# Patient Record
Sex: Male | Born: 1995 | Race: White | Hispanic: No | Marital: Married | Smoking: Former smoker
Health system: Southern US, Community
[De-identification: ages and names within clinical notes are randomized; demographics above are authoritative.]

## PROBLEM LIST (undated history)

## (undated) DIAGNOSIS — L409 Psoriasis, unspecified: Secondary | ICD-10-CM

---
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
## 2016-11-30 ENCOUNTER — Encounter (HOSPITAL_COMMUNITY): Payer: Self-pay | Admitting: *Deleted

## 2016-11-30 ENCOUNTER — Emergency Department (HOSPITAL_COMMUNITY)
Admission: EM | Admit: 2016-11-30 | Discharge: 2016-11-30 | Disposition: A | Discharge status: Home / Self Care | Payer: 59 | Attending: Emergency Medicine | Admitting: Emergency Medicine

## 2016-11-30 DIAGNOSIS — Z87891 Personal history of nicotine dependence: Secondary | ICD-10-CM | POA: Insufficient documentation

## 2016-11-30 DIAGNOSIS — Y9389 Activity, other specified: Secondary | ICD-10-CM | POA: Diagnosis not present

## 2016-11-30 DIAGNOSIS — Y999 Unspecified external cause status: Secondary | ICD-10-CM | POA: Insufficient documentation

## 2016-11-30 DIAGNOSIS — Z79899 Other long term (current) drug therapy: Secondary | ICD-10-CM | POA: Diagnosis not present

## 2016-11-30 DIAGNOSIS — Y92410 Unspecified street and highway as the place of occurrence of the external cause: Secondary | ICD-10-CM | POA: Diagnosis not present

## 2016-11-30 DIAGNOSIS — M542 Cervicalgia: Secondary | ICD-10-CM | POA: Insufficient documentation

## 2016-11-30 MED ORDER — CYCLOBENZAPRINE HCL 10 MG PO TABS: 10.0000 mg | ORAL_TABLET | Freq: Two times a day (BID) | ORAL | 0 refills | Status: DC | PRN

## 2016-11-30 MED ORDER — NAPROXEN 375 MG PO TABS: 375.0000 mg | ORAL_TABLET | Freq: Two times a day (BID) | ORAL | 0 refills | Status: DC
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
## 2016-11-30 NOTE — Discharge Instructions (Signed)
Wear the collar as needed for comfort
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
## 2016-11-30 NOTE — ED Notes (Signed)
Pt is complaining of right sided neck pain that is a 6/10. No headache. Is able to move head in all directions, just painful

## 2017-10-03 ENCOUNTER — Emergency Department (HOSPITAL_COMMUNITY)
Admission: EM | Admit: 2017-10-03 | Discharge: 2017-10-03 | Disposition: A | Discharge status: Home / Self Care | Payer: 59 | Attending: Emergency Medicine | Admitting: Emergency Medicine

## 2017-10-03 ENCOUNTER — Other Ambulatory Visit: Payer: Self-pay

## 2017-10-03 ENCOUNTER — Encounter (HOSPITAL_COMMUNITY): Payer: Self-pay | Admitting: Emergency Medicine

## 2017-10-03 ENCOUNTER — Emergency Department (HOSPITAL_COMMUNITY): Payer: 59

## 2017-10-03 DIAGNOSIS — N50811 Right testicular pain: Secondary | ICD-10-CM | POA: Diagnosis present

## 2017-10-03 DIAGNOSIS — F17220 Nicotine dependence, chewing tobacco, uncomplicated: Secondary | ICD-10-CM | POA: Insufficient documentation

## 2017-10-03 DIAGNOSIS — R102 Pelvic and perineal pain: Secondary | ICD-10-CM

## 2017-10-03 LAB — URINALYSIS, ROUTINE W REFLEX MICROSCOPIC
BILIRUBIN URINE: NEGATIVE
Glucose, UA: NEGATIVE mg/dL
HGB URINE DIPSTICK: NEGATIVE
Ketones, ur: NEGATIVE mg/dL
Leukocytes, UA: NEGATIVE
NITRITE: NEGATIVE
PROTEIN: NEGATIVE mg/dL
Specific Gravity, Urine: 1.003 — ABNORMAL LOW (ref 1.005–1.030)
pH: 7 (ref 5.0–8.0)

## 2017-10-03 MED ORDER — IBUPROFEN 600 MG PO TABS: 600.0000 mg | ORAL_TABLET | Freq: Four times a day (QID) | ORAL | 0 refills | Status: AC

## 2017-10-03 MED ORDER — HYDROCODONE-ACETAMINOPHEN 5-325 MG PO TABS
2.0000 | ORAL_TABLET | Freq: Once | ORAL | Status: AC
  Administered 2017-10-03: 2 via ORAL
  Filled 2017-10-03: qty 2

## 2017-10-03 MED ORDER — ONDANSETRON HCL 4 MG PO TABS
4.0000 mg | ORAL_TABLET | Freq: Once | ORAL | Status: AC
  Administered 2017-10-03: 4 mg via ORAL
  Filled 2017-10-03: qty 1

## 2017-10-03 MED ORDER — METHOCARBAMOL 500 MG PO TABS: 500.0000 mg | ORAL_TABLET | Freq: Three times a day (TID) | ORAL | 0 refills | Status: DC

## 2017-10-03 NOTE — Discharge Instructions (Signed)
Your blood pressure is slightly elevated at 166/94, otherwise your vital signs within normal limits.  Your urine test is negative for kidney stone, or kidney infection, or other acute problems.  Your ultrasound is negative for testicular torsion, scrotal mass, or acute abnormality within the scrotal sac area.  I suspect that you have muscle strain in the perineal area with referred pain to the testicle area.  [facility] see [facility] for additional evaluation if this is not improving.  Please use 600 mg of ibuprofen, and 1000 mg of Tylenol with breakfast, lunch, dinner, and at bedtime.  Please use Robaxin for spasm type pain in the perineal area.  If this is not improving, please see the urology specialist.

## 2017-10-03 NOTE — ED Provider Notes (Signed)
[facility] EMERGENCY DEPARTMENT Provider Note   CSN: [id number] Arrival date & time: 10/03/17  1730     History   Chief Complaint Chief Complaint  Patient presents with  . Testicle Pain    HPI Dillon Mcdonald is a 22 y.o. male.  Patient is a 22 year old male who presents to the emergency department with a complaint of testicular pain.  Patient states that the pain seems to come and go.  It is been going on for the past 3 days.  The patient denies any recent injury or trauma to the area.  He denies any pain of the penis.  He has not had any drainage from the penis.  No discharge or blood from the penis.  No recent operations or procedures involving the testicle area.  The history is provided by the patient.  Testicle Pain  Pertinent negatives include no chest pain, no abdominal pain and no shortness of breath.    History reviewed. No pertinent past medical history.  There are no active problems to display for this patient.   History reviewed. No pertinent surgical history.      Home Medications    Prior to Admission medications   Medication Sig Start Date End Date Taking? Authorizing Provider  cetirizine (ZYRTEC) 10 MG tablet Take 1 tablet (10 mg total) by mouth daily. 05/04/12   Laurell Josephs, MD  cyclobenzaprine (FLEXERIL) 10 MG tablet Take 1 tablet (10 mg total) by mouth 2 (two) times daily as needed for muscle spasms. 11/30/16   Felicie Morn, NP  naproxen (NAPROSYN) 375 MG tablet Take 1 tablet (375 mg total) by mouth 2 (two) times daily. 11/30/16   Felicie Morn, NP    Family History No family history on file.  Social History Social History   Tobacco Use  . Smoking status: Former Games developer  . Smokeless tobacco: Current User  Substance Use Topics  . Alcohol use: Yes    Alcohol/week: 6.0 standard drinks    Types: 6 Cans of beer per week    Comment: every other day   . Drug use: Not Currently     Allergies   Patient has no known allergies.   Review of  Systems Review of Systems  Constitutional: Negative for activity change.       All ROS Neg except as noted in HPI  HENT: Negative for nosebleeds.   Eyes: Negative for photophobia and discharge.  Respiratory: Negative for cough, shortness of breath and wheezing.   Cardiovascular: Negative for chest pain and palpitations.  Gastrointestinal: Negative for abdominal pain and blood in stool.  Genitourinary: Positive for testicular pain. Negative for dysuria, frequency and hematuria.  Musculoskeletal: Negative for arthralgias, back pain and neck pain.  Skin: Negative.   Neurological: Negative for dizziness, seizures and speech difficulty.  Psychiatric/Behavioral: Negative for confusion and hallucinations.     Physical Exam Updated Vital Signs BP (!) 166/94 (BP Location: Right Arm)   Pulse 80   Temp 98.2 F (36.8 C) (Oral)   Resp 12   Ht 5\' 9"  (1.753 m)   Wt 77.1 kg   SpO2 98%   BMI 25.10 kg/m   Physical Exam  Constitutional: He is oriented to person, place, and time. He appears well-developed and well-nourished.  Non-toxic appearance.  HENT:  Head: Normocephalic.  Right Ear: Tympanic membrane and external ear normal.  Left Ear: Tympanic membrane and external ear normal.  Eyes: Pupils are equal, round, and reactive to light. EOM and lids are normal.  Neck: Normal range of motion. Neck supple. Carotid bruit is not present.  Cardiovascular: Normal rate, regular rhythm, normal heart sounds, intact distal pulses and normal pulses.  Pulmonary/Chest: Breath sounds normal. No respiratory distress.  Abdominal: Soft. Bowel sounds are normal. There is no tenderness. There is no guarding.  Genitourinary:  Genitourinary Comments: Patient significant other is in the room during the examination.  No abnormality noted of the penis.  The patient is circumcised.  Both testicles are descended in the scrotal sac.  There is no mass appreciated. There is some tenderness of the right testicle.  No  tenderness of the left.  No unusual swelling about the testicle.  There is also tenderness in the perineal area.  There is no drainage or discharge or broken skin in this area.  Musculoskeletal: Normal range of motion.  Lymphadenopathy:       Head (right side): No submandibular adenopathy present.       Head (left side): No submandibular adenopathy present.    He has no cervical adenopathy.  Neurological: He is alert and oriented to person, place, and time. He has normal strength. No cranial nerve deficit or sensory deficit.  Skin: Skin is warm and dry.  Psychiatric: He has a normal mood and affect. His speech is normal.  Nursing note and vitals reviewed.    ED Treatments / Results  Labs (all labs ordered are listed, but only abnormal results are displayed) Labs Reviewed  URINALYSIS, ROUTINE W REFLEX MICROSCOPIC - Abnormal; Notable for the following components:      Result Value   Color, Urine COLORLESS (*)    Specific Gravity, Urine 1.003 (*)    All other components within normal limits    EKG None  Radiology [place] Scrotum W/doppler  Result Date: 10/03/2017 CLINICAL DATA:  22 year old male with 3 days of right testicular pain. EXAM: SCROTAL ULTRASOUND DOPPLER ULTRASOUND OF THE TESTICLES TECHNIQUE: Complete ultrasound examination of the testicles, epididymis, and other scrotal structures was performed. Color and spectral Doppler ultrasound were also utilized to evaluate blood flow to the testicles. COMPARISON:  None. FINDINGS: Right testicle Measurements: 4.3 x 2.2 x 2.9 centimeters. No mass or microlithiasis visualized. Left testicle Measurements: 4.4 x 1.8 x 2.7 centimeters. No mass or microlithiasis visualized. Right epididymis:  Normal in size and appearance. Left epididymis:  Normal in size and appearance. Hydrocele:  None visualized. Varicocele:  None visualized. Pulsed Doppler interrogation of both testes demonstrates normal low resistance arterial and venous waveforms bilaterally.  IMPRESSION: Negative for testicular torsion. Normal scrotal ultrasound with Doppler. Electronically Signed   By: Odessa FlemingH  Hall M.D.   On: 10/03/2017 19:45    Procedures Procedures (including critical care time)  Medications Ordered in ED Medications  HYDROcodone-acetaminophen (NORCO/VICODIN) 5-325 MG per tablet 2 tablet (2 tablets Oral Given 10/03/17 1836)  ondansetron (ZOFRAN) tablet 4 mg (4 mg Oral Given 10/03/17 1835)     Initial Impression / Assessment and Plan / ED Course  I have reviewed the triage vital signs and the nursing notes.  Pertinent labs & imaging results that were available during my care of the patient were reviewed by me and considered in my medical decision making (see chart for details).     You  Final Clinical Impressions(s) / ED Diagnoses MDM  Blood pressure slightly elevated at 141/97, otherwise vital signs within normal limits.  Pulse oximetry is 96% on room air.  Within normal limits by my interpretation.  The patient has some right testicular tenderness, as well as  some peritoneal tenderness.  Will obtain an ultrasound to rule out torsion, mass, or other related issues.   Urine analysis is negative for any acute changes or problems.  Ultrasound is completely normal.  I suspect that the patient has muscle strain in the perineal area with referred testicular pain.  I have asked the patient to use ibuprofen and Tylenol with breakfast, lunch, dinner, and at bedtime.  I have also asked the patient to see the physicians at [facility] urology for additional evaluation if not improving.  Patient is in agreement with this plan.   Final diagnoses:  Pain in right testicle    ED Discharge Orders         Ordered    ibuprofen (ADVIL,MOTRIN) 600 MG tablet  4 times daily     10/03/17 2028    methocarbamol (ROBAXIN) 500 MG tablet  3 times daily     10/03/17 2028           Ivery Quale, PA-C 10/03/17 2040    Raeford Razor, MD 10/04/17 1450

## 2017-10-03 NOTE — ED Triage Notes (Signed)
Testicular pain on and off x 3 days.  Denies any urinary s/s

## 2018-03-11 ENCOUNTER — Encounter (HOSPITAL_COMMUNITY): Payer: Self-pay | Admitting: Emergency Medicine

## 2018-03-11 ENCOUNTER — Emergency Department (HOSPITAL_COMMUNITY)
Admission: EM | Admit: 2018-03-11 | Discharge: 2018-03-11 | Disposition: A | Discharge status: Home / Self Care | Payer: 59 | Attending: Emergency Medicine | Admitting: Emergency Medicine

## 2018-03-11 ENCOUNTER — Other Ambulatory Visit: Payer: Self-pay

## 2018-03-11 ENCOUNTER — Emergency Department (HOSPITAL_COMMUNITY): Payer: 59

## 2018-03-11 DIAGNOSIS — S90871A Other superficial bite of right foot, initial encounter: Secondary | ICD-10-CM | POA: Insufficient documentation

## 2018-03-11 DIAGNOSIS — Y998 Other external cause status: Secondary | ICD-10-CM | POA: Insufficient documentation

## 2018-03-11 DIAGNOSIS — W540XXA Bitten by dog, initial encounter: Secondary | ICD-10-CM | POA: Insufficient documentation

## 2018-03-11 DIAGNOSIS — Z87891 Personal history of nicotine dependence: Secondary | ICD-10-CM | POA: Diagnosis not present

## 2018-03-11 DIAGNOSIS — Y929 Unspecified place or not applicable: Secondary | ICD-10-CM | POA: Insufficient documentation

## 2018-03-11 DIAGNOSIS — Z79899 Other long term (current) drug therapy: Secondary | ICD-10-CM | POA: Diagnosis not present

## 2018-03-11 DIAGNOSIS — Y9389 Activity, other specified: Secondary | ICD-10-CM | POA: Diagnosis not present

## 2018-03-11 MED ORDER — AMOXICILLIN-POT CLAVULANATE 875-125 MG PO TABS
1.0000 | ORAL_TABLET | Freq: Once | ORAL | Status: AC
  Administered 2018-03-11: 1 via ORAL
  Filled 2018-03-11: qty 1

## 2018-03-11 MED ORDER — IBUPROFEN 800 MG PO TABS
800.0000 mg | ORAL_TABLET | Freq: Once | ORAL | Status: AC
  Administered 2018-03-11: 800 mg via ORAL
  Filled 2018-03-11: qty 1

## 2018-03-11 MED ORDER — AMOXICILLIN-POT CLAVULANATE 875-125 MG PO TABS: 1.0000 | ORAL_TABLET | Freq: Two times a day (BID) | ORAL | 0 refills | Status: DC

## 2018-03-11 MED ORDER — NAPROXEN 500 MG PO TABS: 500.0000 mg | ORAL_TABLET | Freq: Two times a day (BID) | ORAL | 0 refills | Status: AC

## 2018-03-11 MED ORDER — HYDROCODONE-ACETAMINOPHEN 5-325 MG PO TABS: 1.0000 | ORAL_TABLET | Freq: Four times a day (QID) | ORAL | 0 refills | Status: DC | PRN

## 2018-03-11 NOTE — ED Triage Notes (Signed)
Pt was accidentally bitten by his dog last to RT foot. Pt has bruising, edema, and redness. Pt states area is warm to touch. Denies fever and discharge at this time. Reports that it is extremely painful to walk. Dog is UTD on all vaccinations per family.

## 2018-03-11 NOTE — Discharge Instructions (Signed)
Your x-ray shows no signs of retained foreign body, the redness of the skin suggest an early infection, take Augmentin twice daily for 7 days, Naprosyn 500 mg twice a day as needed, try to stay off the foot keep it elevated and keep ice on it to prevent increased swelling however if you should develop increasing pain, swelling, fever you should return to the emergency department immediately.  Otherwise see your doctor in 48 hours for recheck.  If you do not have a doctor see the list below.  [facility] Primary Care Doctor List    Kari Baars MD. Specialty: Pulmonary Disease Contact information: [address]  [address]  [place] [address]  [phone]   Syliva Overman, MD. Specialty: [facility] Medicine Contact information: [address]  [place] [address]  [phone]   Lilyan Punt, MD. Specialty: [facility] Medicine Contact information: [address]  [place] [address]  [phone]   Avon Gully, MD Specialty: Internal Medicine Contact information: [address] [place] [address]  [phone]   Catalina Pizza, MD. Specialty: Internal Medicine Contact information: [address]  [place] [address]  [phone]    [facility] (Dr. Selena Batten) Specialty: Family Medicine Contact information: [address]  [place] [address]  [phone]   John Giovanni, MD. Specialty: [facility] Medicine Contact information: [address]  [address]  [place] [address]  [phone]   Carylon Perches, MD. Specialty: Internal Medicine Contact information: [address]  [address]  [place] [address]  [phone]    [facility]  [address] [place], [place] [address] [phone]  Services [facility] offers a variety of basic health services.  Services include but are not limited to: Blood pressure checks  Heart rate checks  Blood  sugar checks  Urine analysis  Rapid strep tests  Pregnancy tests.  Health education and referrals  People needing more complex services will be directed to a physician online. Using these virtual visits, doctors can evaluate and prescribe medicine and treatments. There will be no medication on-site, though [facility] will help patients fill their prescriptions at little to no cost.   For More information please go to: [email or web address]

## 2018-03-11 NOTE — ED Provider Notes (Signed)
[facility] EMERGENCY DEPARTMENT Provider Note   CSN: [id number] Arrival date & time: 03/11/18  1016     History   Chief Complaint Chief Complaint  Patient presents with  . Animal Bite    HPI Dillon Mcdonald is a 23 y.o. male.  HPI  23 year old male, no significant past medical history who presents after suffering an injury to his right foot which occurred last night when he stuck his foot between 2 of his dogs who were wrestling.  1 of them bit him on the foot causing a puncture wound to both the plantar aspect of the foot as well as the dorsum of the foot.  Since that time he has had pain redness and mild swelling in the puncture areas, the pain does not radiate up his leg, he does not have any fevers or chills, he is up-to-date on tetanus and his dogs were up-to-date on all of their vaccinations as well.  He states that the dogs were acting in their normal behavior and he was just trying to separate them with his foot while they were wrestling.  Pain is persistent, it was treated with hydrogen peroxide prior to arrival  History reviewed. No pertinent past medical history.  There are no active problems to display for this patient.   History reviewed. No pertinent surgical history.      Home Medications    Prior to Admission medications   Medication Sig Start Date End Date Taking? Authorizing Provider  cetirizine (ZYRTEC) 10 MG tablet Take 1 tablet (10 mg total) by mouth daily. 05/04/12   Laurell JosephsKhalifa, Dalia A, MD  cyclobenzaprine (FLEXERIL) 10 MG tablet Take 1 tablet (10 mg total) by mouth 2 (two) times daily as needed for muscle spasms. 11/30/16   Felicie MornSmith, David, NP  ibuprofen (ADVIL,MOTRIN) 600 MG tablet Take 1 tablet (600 mg total) by mouth 4 (four) times daily. 10/03/17   Ivery QualeBryant, Hobson, PA-C  methocarbamol (ROBAXIN) 500 MG tablet Take 1 tablet (500 mg total) by mouth 3 (three) times daily. 10/03/17   Ivery QualeBryant, Hobson, PA-C  naproxen (NAPROSYN) 375 MG tablet Take 1 tablet (375 mg total)  by mouth 2 (two) times daily. 11/30/16   Felicie MornSmith, David, NP    Family History No family history on file.  Social History Social History   Tobacco Use  . Smoking status: Former Games developermoker  . Smokeless tobacco: Current User  Substance Use Topics  . Alcohol use: Yes    Alcohol/week: 6.0 standard drinks    Types: 6 Cans of beer per week    Comment: every other day   . Drug use: Not Currently     Allergies   Patient has no known allergies.   Review of Systems Review of Systems  Constitutional: Negative for fever.  Musculoskeletal: Positive for myalgias.  Skin: Positive for wound.     Physical Exam Updated Vital Signs BP 112/86 (BP Location: Right Arm)   Pulse 84   Temp 98.2 F (36.8 C) (Oral)   Resp 16   Ht 1.727 m (5\' 8" )   Wt 79.4 kg   SpO2 99%   BMI 26.61 kg/m   Physical Exam Vitals signs and nursing note reviewed.  Constitutional:      Appearance: He is well-developed. He is not diaphoretic.  HENT:     Head: Normocephalic and atraumatic.  Eyes:     General:        Right eye: No discharge.        Left eye: No discharge.  Conjunctiva/sclera: Conjunctivae normal.  Pulmonary:     Effort: Pulmonary effort is normal. No respiratory distress.  Musculoskeletal:     Comments: Tenderness over the foot over the puncture wounds, see below, no other joint swelling or tenderness or lymphangitic spread of redness.  Skin:    General: Skin is warm and dry.     Findings: Rash present. No erythema.     Comments: There is a puncture wound to the plantar aspect of the right foot with some surrounding redness, minimal swelling, no drainage, no visible foreign body, slight puncture wound to the dorsum of the foot as well.  No swelling of the ankle  Neurological:     Mental Status: He is alert.     Coordination: Coordination normal.      ED Treatments / Results  Labs (all labs ordered are listed, but only abnormal results are displayed) Labs Reviewed - No data to  display  EKG None  Radiology No results found.  Procedures Procedures (including critical care time)  Medications Ordered in ED Medications  ibuprofen (ADVIL,MOTRIN) tablet 800 mg (has no administration in time range)  amoxicillin-clavulanate (AUGMENTIN) 875-125 MG per tablet 1 tablet (has no administration in time range)     Initial Impression / Assessment and Plan / ED Course  I have reviewed the triage vital signs and the nursing notes.  Pertinent labs & imaging results that were available during my care of the patient were reviewed by me and considered in my medical decision making (see chart for details).     Well-appearing, rule out foreign body, evaluate with x-ray, tetanus is up-to-date, pain control antibiotics.      The patient has no signs of foreign body on x-ray, he is stable appearing, I have encouraged him to stay off his foot keep it elevated, pain medicines and antibiotics have been given.  [facility], he has no history of opiate filling.  Final Clinical Impressions(s) / ED Diagnoses   Final diagnoses:  Dog bite, initial encounter    ED Discharge Orders         Ordered    amoxicillin-clavulanate (AUGMENTIN) 875-125 MG tablet  Every 12 hours     03/11/18 1302    naproxen (NAPROSYN) 500 MG tablet  2 times daily with meals     03/11/18 1302    HYDROcodone-acetaminophen (NORCO/VICODIN) 5-325 MG tablet  Every 6 hours PRN     03/11/18 1302           Eber HongMiller, Odies Desa, MD 03/11/18 1304

## 2019-01-18 ENCOUNTER — Other Ambulatory Visit: Payer: Self-pay

## 2019-01-18 DIAGNOSIS — Z20822 Contact with and (suspected) exposure to covid-19: Secondary | ICD-10-CM

## 2019-01-19 LAB — NOVEL CORONAVIRUS, NAA: SARS-CoV-2, NAA: NOT DETECTED

## 2019-02-19 ENCOUNTER — Ambulatory Visit: Payer: BC Managed Care – PPO | Attending: Internal Medicine

## 2019-02-19 ENCOUNTER — Other Ambulatory Visit: Payer: Self-pay

## 2019-02-19 DIAGNOSIS — Z20822 Contact with and (suspected) exposure to covid-19: Secondary | ICD-10-CM | POA: Insufficient documentation

## 2019-02-20 LAB — NOVEL CORONAVIRUS, NAA: SARS-CoV-2, NAA: NOT DETECTED

## 2019-02-23 ENCOUNTER — Other Ambulatory Visit: Payer: Self-pay

## 2019-05-17 ENCOUNTER — Telehealth: Payer: Self-pay

## 2019-05-17 NOTE — Telephone Encounter (Signed)
Fax received from [facility] stating that they are no longer the contracted provider for the patient's medication Otezla.  They did forward the referral to the correct Pharmacy [facility].  See below.

## 2019-05-23 ENCOUNTER — Telehealth: Payer: Self-pay

## 2019-05-23 DIAGNOSIS — L400 Psoriasis vulgaris: Secondary | ICD-10-CM

## 2019-05-23 MED ORDER — OTEZLA 30 MG PO TABS: 30.0000 mg | ORAL_TABLET | Freq: Two times a day (BID) | ORAL | 0 refills | Status: DC

## 2019-05-23 NOTE — Telephone Encounter (Signed)
Phone call from patient stating that he needs a refill on his [place] sent to [facility].  I informed patient that he needs an appointment and that we can give him one refill until his appointment.  Patient okay with that, appointment made with Mackey Birchwood on 06/19/2019 @ 8:15am.

## 2019-06-19 ENCOUNTER — Ambulatory Visit: Payer: Self-pay | Admitting: Physician Assistant

## 2019-10-23 ENCOUNTER — Other Ambulatory Visit: Payer: Self-pay | Admitting: Physician Assistant

## 2019-10-23 DIAGNOSIS — L400 Psoriasis vulgaris: Secondary | ICD-10-CM

## 2019-10-23 NOTE — Telephone Encounter (Signed)
Wants refill of Otezla. Mail order pharmacy where he had gotten it is out of business + he was switched to another place and was told they needed to get info from [place] that they never got. Wants to know if he can have refill without ov. KRS patient. Chart [phone]

## 2019-10-23 NOTE — Telephone Encounter (Signed)
We can refill otezla as long as patient has been seen within the year.  Tried calling patient to see what pharmacy to send prescription of otezla too. Unable to leave voicemail; voicemail full

## 2019-10-24 MED ORDER — OTEZLA 30 MG PO TABS: 30.0000 mg | ORAL_TABLET | Freq: Two times a day (BID) | ORAL | 2 refills | Status: DC

## 2019-10-24 NOTE — Telephone Encounter (Signed)
Patient called back and prescription sent to [facility] in .

## 2019-10-24 NOTE — Telephone Encounter (Signed)
Patient returned call and asked that refill be sent to [facility] on [address] in [place]

## 2019-10-25 ENCOUNTER — Other Ambulatory Visit: Payer: Self-pay | Admitting: Physician Assistant

## 2019-10-25 DIAGNOSIS — L400 Psoriasis vulgaris: Secondary | ICD-10-CM

## 2019-10-25 MED ORDER — OTEZLA 30 MG PO TABS: 30.0000 mg | ORAL_TABLET | Freq: Two times a day (BID) | ORAL | 2 refills | Status: DC

## 2019-10-25 MED ORDER — OTEZLA 10 & 20 & 30 MG PO TBPK: ORAL_TABLET | ORAL | 0 refills | Status: DC

## 2019-10-25 NOTE — Telephone Encounter (Signed)
Spoke with patient and got updated insurance; also informed patient that [facility] doesn't typically rill biologics. Sent prescription to [facility].  Patient states he hasn't taken for 3 months so per [facility] [place] patient needs starter dose too.

## 2019-10-25 NOTE — Telephone Encounter (Signed)
Rx refill was received by the pharmacy but his insurance changed and it needs a PA from [facility] per patient. He brought his new insurance card to the office and it was updated in Epic.

## 2019-11-03 ENCOUNTER — Encounter (HOSPITAL_COMMUNITY): Payer: Self-pay | Admitting: Emergency Medicine

## 2019-11-03 ENCOUNTER — Emergency Department (HOSPITAL_COMMUNITY): Payer: BC Managed Care – PPO

## 2019-11-03 ENCOUNTER — Emergency Department (HOSPITAL_COMMUNITY)
Admission: EM | Admit: 2019-11-03 | Discharge: 2019-11-03 | Disposition: A | Discharge status: Home / Self Care | Payer: BC Managed Care – PPO | Attending: Emergency Medicine | Admitting: Emergency Medicine

## 2019-11-03 ENCOUNTER — Other Ambulatory Visit: Payer: Self-pay

## 2019-11-03 DIAGNOSIS — R072 Precordial pain: Secondary | ICD-10-CM | POA: Diagnosis not present

## 2019-11-03 DIAGNOSIS — F419 Anxiety disorder, unspecified: Secondary | ICD-10-CM | POA: Diagnosis not present

## 2019-11-03 DIAGNOSIS — Z87891 Personal history of nicotine dependence: Secondary | ICD-10-CM | POA: Diagnosis not present

## 2019-11-03 HISTORY — DX: Psoriasis, unspecified: L40.9

## 2019-11-03 MED ORDER — IBUPROFEN 800 MG PO TABS
800.0000 mg | ORAL_TABLET | Freq: Once | ORAL | Status: AC
Start: 2019-11-03 — End: 2019-11-03
  Administered 2019-11-03: 800 mg via ORAL
  Filled 2019-11-03: qty 1

## 2019-11-03 NOTE — ED Triage Notes (Signed)
Pt states he felt like his heart racing last night finally went to sleep. Started again this morning and checked bp and  It was 157/94 then later 176/103. States chest feels tight. Denies light headedness, dizziness, change in vision. Pt states BP was high when he was a teenager. States he has some anxiety but never been dx or treated. Denies alcohol consumption and illicit drug use. States he had one cup of coffee yesterday morning.

## 2019-11-03 NOTE — ED Notes (Signed)
Reports felt that his heart was racing last night and had difficulty sleeping   Felt the same upon awakening   Hx HTN   Reports anxiety but sees no provider for same

## 2019-11-03 NOTE — Discharge Instructions (Addendum)
Your EKG and chest x-ray today are reassuring.  The recheck of your blood pressure was also improved.  You may want to have your blood pressure rechecked by your primary care provider next week.  Return to the ER if you develop any worsening symptoms.

## 2019-11-06 ENCOUNTER — Emergency Department (HOSPITAL_COMMUNITY): Payer: BC Managed Care – PPO

## 2019-11-06 ENCOUNTER — Other Ambulatory Visit: Payer: Self-pay

## 2019-11-06 ENCOUNTER — Encounter (HOSPITAL_COMMUNITY): Payer: Self-pay | Admitting: Emergency Medicine

## 2019-11-06 ENCOUNTER — Other Ambulatory Visit: Payer: BC Managed Care – PPO

## 2019-11-06 ENCOUNTER — Ambulatory Visit: Payer: Self-pay

## 2019-11-06 ENCOUNTER — Emergency Department (HOSPITAL_COMMUNITY)
Admission: EM | Admit: 2019-11-06 | Discharge: 2019-11-06 | Disposition: A | Discharge status: Home / Self Care | Payer: BC Managed Care – PPO | Attending: Emergency Medicine | Admitting: Emergency Medicine

## 2019-11-06 DIAGNOSIS — Z20822 Contact with and (suspected) exposure to covid-19: Secondary | ICD-10-CM | POA: Insufficient documentation

## 2019-11-06 DIAGNOSIS — R079 Chest pain, unspecified: Secondary | ICD-10-CM | POA: Diagnosis not present

## 2019-11-06 DIAGNOSIS — R0602 Shortness of breath: Secondary | ICD-10-CM | POA: Diagnosis not present

## 2019-11-06 DIAGNOSIS — Z5321 Procedure and treatment not carried out due to patient leaving prior to being seen by health care provider: Secondary | ICD-10-CM | POA: Insufficient documentation

## 2019-11-06 LAB — BASIC METABOLIC PANEL
Anion gap: 11 (ref 5–15)
BUN: 10 mg/dL (ref 6–20)
CO2: 26 mmol/L (ref 22–32)
Calcium: 9.8 mg/dL (ref 8.9–10.3)
Chloride: 103 mmol/L (ref 98–111)
Creatinine, Ser: 0.77 mg/dL (ref 0.61–1.24)
GFR calc Af Amer: >60 mL/min (ref >60)
GFR calc non Af Amer: >60 mL/min (ref >60)
Glucose, Bld: 85 mg/dL (ref 70–99)
Potassium: 3.7 mmol/L (ref 3.5–5.1)
Sodium: 140 mmol/L (ref 135–145)

## 2019-11-06 LAB — RESP PANEL BY RT PCR (RSV, FLU A&B, COVID)
Influenza A by PCR: NEGATIVE
Influenza B by PCR: NEGATIVE
Respiratory Syncytial Virus by PCR: NEGATIVE
SARS Coronavirus 2 by RT PCR: NEGATIVE

## 2019-11-06 LAB — TROPONIN I (HIGH SENSITIVITY): Troponin I (High Sensitivity): 2 ng/L (ref <18)

## 2019-11-06 LAB — CBC
HCT: 44.7 % (ref 39.0–52.0)
Hemoglobin: 15.4 g/dL (ref 13.0–17.0)
MCH: 31.2 pg (ref 26.0–34.0)
MCHC: 34.5 g/dL (ref 30.0–36.0)
MCV: 90.7 fL (ref 80.0–100.0)
Platelets: 190 10*3/uL (ref 150–400)
RBC: 4.93 MIL/uL (ref 4.22–5.81)
RDW: 11.8 % (ref 11.5–15.5)
WBC: 6.5 10*3/uL (ref 4.0–10.5)
nRBC: 0 % (ref 0.0–0.2)

## 2019-11-06 NOTE — ED Triage Notes (Signed)
Pt c/o chest pain and sob since Sat. Pt was seen for the same here. Pt wants covid test.

## 2019-11-06 NOTE — ED Provider Notes (Signed)
[facility] EMERGENCY DEPARTMENT Provider Note   CSN: [id number] Arrival date & time: 11/03/19  1152     History Chief Complaint  Patient presents with  . Hypertension    Dillon Mcdonald is a 24 y.o. male.  HPI      Dillon Mcdonald is a 24 y.o. male who presents to the Emergency Department complaining of heart racing and feeling anxious.  Symptoms began on the evening prior to arrival.  He states that he woke up this morning and felt the symptoms returned again.  He checked his blood pressure, it was 157/94 10 to 15 minutes later he checked it again and it was 176/103 he states he began feeling tightness in his chest and a family member suggested that he come to the emergency department for further evaluation.  He does not describe having chest pain.  He does admit to occasional episodes of feeling anxious and admits to increased stressors at work recently.  He denies any history of heart disease or significant family history of early onset heart disease.  He denies alcohol or drug use, shortness of breath, headache, dizziness, visual changes, and syncope.     Past Medical History:  Diagnosis Date  . Psoriasis     There are no problems to display for this patient.   History reviewed. No pertinent surgical history.     No family history on file.  Social History   Tobacco Use  . Smoking status: Former Games developer  . Smokeless tobacco: Current User  Vaping Use  . Vaping Use: Every day  Substance Use Topics  . Alcohol use: Yes    Alcohol/week: 6.0 standard drinks    Types: 6 Cans of beer per week    Comment: every other day   . Drug use: Not Currently    Home Medications Prior to Admission medications   Medication Sig Start Date End Date Taking? Authorizing Provider  alclomethasone (ACLOVATE) 0.05 % cream Apply 1 application topically 2 (two) times daily.  02/21/18   [provider]  amoxicillin-clavulanate (AUGMENTIN) 875-125 MG tablet Take 1 tablet by mouth every 12  (twelve) hours. 03/11/18   Eber Hong, MD  Apremilast (OTEZLA) 10 & 20 & 30 MG TBPK Take as directed per package instructions. 10/25/19   Sheffield, Judye Bos, PA-C  Apremilast (OTEZLA) 30 MG TABS Take 1 tablet (30 mg total) by mouth 2 (two) times daily. 10/25/19   Sheffield, Harvin Hazel R, PA-C  clobetasol (TEMOVATE) 0.05 % external solution APPLY TO THE AFFECTED AREA EVERY DAY TO TWICE DAILY 10/23/19   Janalyn Harder, MD  fluocinonide cream (LIDEX) 0.05 % Apply 1 application topically 2 (two) times daily.  02/21/18   [provider]  HYDROcodone-acetaminophen (NORCO/VICODIN) 5-325 MG tablet Take 1 tablet by mouth every 6 (six) hours as needed. 03/11/18   Eber Hong, MD  ibuprofen (ADVIL,MOTRIN) 600 MG tablet Take 1 tablet (600 mg total) by mouth 4 (four) times daily. Patient taking differently: Take 400 mg by mouth every 6 (six) hours as needed for headache or mild pain.  10/03/17   Ivery Quale, PA-C  ketoconazole (NIZORAL) 2 % shampoo Apply 1 application topically 2 (two) times a week.  02/21/18   [provider]  naproxen (NAPROSYN) 500 MG tablet Take 1 tablet (500 mg total) by mouth 2 (two) times daily with a meal. 03/11/18   Eber Hong, MD    Allergies    Patient has no known allergies.  Review of Systems   Review of Systems  Constitutional: Negative for chills, fatigue and fever.  HENT: Negative for sore throat and trouble swallowing.   Eyes: Negative for visual disturbance.  Respiratory: Positive for chest tightness. Negative for cough, shortness of breath and wheezing.   Cardiovascular: Positive for palpitations. Negative for chest pain.  Gastrointestinal: Negative for abdominal pain, blood in stool, nausea and vomiting.  Genitourinary: Negative for dysuria, flank pain and hematuria.  Musculoskeletal: Negative for arthralgias, back pain, myalgias, neck pain and neck stiffness.  Skin: Negative for rash.  Neurological: Negative for dizziness, weakness, numbness and  headaches.  Hematological: Does not bruise/bleed easily.  Psychiatric/Behavioral: The patient is nervous/anxious.     Physical Exam Updated Vital Signs BP 132/89 (BP Location: Right Arm)   Pulse (!) 54   Temp 98.8 F (37.1 C) (Oral)   Resp 16   Ht 5\' 9"  (1.753 m)   Wt 78 kg   SpO2 98%   BMI 25.40 kg/m   Physical Exam Vitals and nursing note reviewed.  Constitutional:      General: He is not in acute distress.    Appearance: Normal appearance. He is not ill-appearing.     Comments: Patient appears slightly anxious  HENT:     Head: Normocephalic.     Mouth/Throat:     Mouth: Mucous membranes are moist.  Eyes:     Extraocular Movements: Extraocular movements intact.     Conjunctiva/sclera: Conjunctivae normal.     Pupils: Pupils are equal, round, and reactive to light.  Neck:     Thyroid: No thyromegaly.     Meningeal: Kernig's sign absent.  Cardiovascular:     Rate and Rhythm: Normal rate and regular rhythm.     Pulses: Normal pulses.  Pulmonary:     Effort: Pulmonary effort is normal. No respiratory distress.     Breath sounds: Normal breath sounds. No wheezing.  Chest:     Chest wall: No tenderness.  Abdominal:     Palpations: Abdomen is soft.     Tenderness: There is no abdominal tenderness. There is no guarding or rebound.  Musculoskeletal:        General: Normal range of motion.     Cervical back: Normal range of motion and neck supple.  Skin:    General: Skin is warm.     Capillary Refill: Capillary refill takes less than 2 seconds.     Findings: No rash.  Neurological:     General: No focal deficit present.     Mental Status: He is alert and oriented to person, place, and time.     Sensory: Sensation is intact. No sensory deficit.     Motor: Motor function is intact. No weakness.     Coordination: Coordination is intact.     Comments: CN II through XII intact.  Speech clear.  No motor weakness.  Psychiatric:        Mood and Affect: Mood is anxious.         Speech: Speech normal.        Behavior: Behavior normal.        Thought Content: Thought content normal.     ED Results / Procedures / Treatments   Labs (all labs ordered are listed, but only abnormal results are displayed) Labs Reviewed - No data to display  EKG EKG Interpretation  Date/Time:  Saturday November 03 2019 12:15:25 EDT Ventricular Rate:  63 PR Interval:  168 QRS Duration: 102 QT Interval:  360 QTC Calculation: 368 R Axis:   91  Text Interpretation: Normal sinus rhythm Rightward axis Early repolarization Borderline ECG ECG OTHERWISE WITHIN NORMAL LIMITS Confirmed by Eber Hong ([id number]) on 11/03/2019 12:37:55 PM   Radiology DG Chest 2 View  Result Date: 11/03/2019 CLINICAL DATA:  Tachycardia EXAM: CHEST - 2 VIEW COMPARISON:  11/10/2007 FINDINGS: The cardiac silhouette, mediastinal and hilar contours are normal. The lungs are clear. No pleural effusion. No pulmonary lesions. The bony thorax is intact. IMPRESSION: Normal chest x-ray. Electronically Signed   By: Rudie Meyer M.D.   On: 11/03/2019 13:21     Procedures Procedures (including critical care time)  Medications Ordered in ED Medications  ibuprofen (ADVIL) tablet 800 mg (800 mg Oral Given 11/03/19 1303)    ED Course  I have reviewed the triage vital signs and the nursing notes.  Pertinent labs & imaging results that were available during my care of the patient were reviewed by me and considered in my medical decision making (see chart for details).    MDM Rules/Calculators/A&P                          Patient here with intermittent chest tightness and feelings of tachycardia.  No prior heart disease or significant family history of early onset heart disease.  Denies alcohol or illicit drug use.  He does admit to recent increased stressors.  No SI or HI.  Chest x-ray is reassuring.  EKG also reassuring with no evidence of acute ischemic changes.  Patient has been reassured.  On recheck, blood  pressure improved.  He is not tachycardic.  Appears more calm.  I feel his symptoms are likely related to anxiety.  He does have a PCP and agrees to arrange close follow-up.  I feel that he is appropriate for discharge home, return precautions were discussed.  Final Clinical Impression(s) / ED Diagnoses Final diagnoses:  Precordial chest pain  Anxiety    Rx / DC Orders ED Discharge Orders    None       Rosey Bath 11/06/19 0934    Eber Hong, MD 11/10/19 [phone]

## 2019-11-06 NOTE — Telephone Encounter (Signed)
Pt. Called to report intermittent chest pain and Shortness of breath since late Friday into Saturday.  Went to ER on Sat.  Was advised symptoms could be related to anxiety.  At present c/o a burning pain around heart left side of chest.  Stated the pain comes and goes but has been present during the 20 min. Triage call.  Stated he has had chills and intermittent nausea.  Reported only dry heaves.  Denied radiation of chest pain.  Has not been tested recently for COVID.  Denied any known exposure to COVID.  Also c/o body aches, fatigue and sore throat. Stated he might feel slightly better since Sat., but voiced concern of the intermittent chest pain.  Denied cough.  Advised to go to [facility] or ER for re-evaluation. Pt. Verb. Understanding.  Stated he will go to the ER now.  Reason for Disposition . Chest pain or pressure  Answer Assessment - Initial Assessment Questions 1. COVID-19 DIAGNOSIS: "Who made your Coronavirus (COVID-19) diagnosis?" "Was it confirmed by a positive lab test?" If not diagnosed by a HCP, ask "Are there lots of cases (community spread) where you live?" (See public health department website, if unsure)     Has not been diagnosed; went to ER on Sat. For chest discomfort 2. COVID-19 EXPOSURE: "Was there any known exposure to COVID before the symptoms began?" CDC Definition of close contact: within 6 feet (2 meters) for a total of 15 minutes or more over a 24-hour period.      No known exposure  3. ONSET: "When did the COVID-19 symptoms start?"      Friday late night into Saturday, 9/25 4. WORST SYMPTOM: "What is your worst symptom?" (e.g., cough, fever, shortness of breath, muscle aches)     c/o chest tightness/ pressure left chest  That comes and goes 5. COUGH: "Do you have a cough?" If Yes, ask: "How bad is the cough?"       Denied cough 6. FEVER: "Do you have a fever?" If Yes, ask: "What is your temperature, how was it measured, and when did it start?"    C/o chills ; no fever  7.  RESPIRATORY STATUS: "Describe your breathing?" (e.g., shortness of breath, wheezing, unable to speak)      Shortness of breath that comes and goes; feels tired when trying to talk  8. BETTER-SAME-WORSE: "Are you getting better, staying the same or getting worse compared to yesterday?"  If getting worse, ask, "In what way?"     Feels a little better compared to when in ER on Sat.  9. HIGH RISK DISEASE: "Do you have any chronic medical problems?" (e.g., asthma, heart or lung disease, weak immune system, obesity, etc.)     Hx of high blood pressure. Denied lung or heart problems 10. PREGNANCY: "Is there any chance you are pregnant?" "When was your last menstrual period?"       N/a  11. OTHER SYMPTOMS: "Do you have any other symptoms?"  (e.g., chills, fatigue, headache, loss of smell or taste, muscle pain, sore throat; new loss of smell or taste especially support the diagnosis of COVID-19)      Fatigue, nausea, body aches, sore throat  Protocols used: CORONAVIRUS (COVID-19) DIAGNOSED OR SUSPECTED-A-AH

## 2020-02-05 ENCOUNTER — Telehealth: Payer: Self-pay | Admitting: *Deleted

## 2020-02-05 NOTE — Telephone Encounter (Signed)
Phone call to patient to let him know that we need to make him an office visit its been over a year to follow up on his otezla. I also faxed over his prior authorization form to [facility] at [phone].

## 2020-02-12 ENCOUNTER — Telehealth: Payer: Self-pay

## 2020-02-12 NOTE — Telephone Encounter (Signed)
Fax received from [facility] stating patient's Dillon Mcdonald is approved form 02/05/2020-02/03/2021.

## 2020-02-13 ENCOUNTER — Telehealth: Payer: Self-pay | Admitting: Dermatology

## 2020-02-15 NOTE — Telephone Encounter (Signed)
error 

## 2020-03-19 ENCOUNTER — Ambulatory Visit: Payer: BC Managed Care – PPO | Admitting: Physician Assistant

## 2020-03-19 ENCOUNTER — Encounter: Payer: Self-pay | Admitting: Physician Assistant

## 2020-03-19 ENCOUNTER — Other Ambulatory Visit: Payer: Self-pay

## 2020-03-19 DIAGNOSIS — Z79899 Other long term (current) drug therapy: Secondary | ICD-10-CM | POA: Diagnosis not present

## 2020-03-19 DIAGNOSIS — L400 Psoriasis vulgaris: Secondary | ICD-10-CM | POA: Diagnosis not present

## 2020-03-19 MED ORDER — ALCLOMETASONE DIPROPIONATE 0.05 % EX CREA: 1.0000 "application " | TOPICAL_CREAM | Freq: Two times a day (BID) | CUTANEOUS | 2 refills | Status: DC

## 2020-03-19 MED ORDER — CLOBETASOL PROPIONATE 0.05 % EX SOLN: CUTANEOUS | 2 refills | Status: DC

## 2020-03-19 MED ORDER — KETOCONAZOLE 2 % EX SHAM: 1.0000 "application " | MEDICATED_SHAMPOO | CUTANEOUS | 2 refills | Status: DC

## 2020-03-19 MED ORDER — FLUOCINONIDE EMULSIFIED BASE 0.05 % EX CREA: 1.0000 "application " | TOPICAL_CREAM | Freq: Two times a day (BID) | CUTANEOUS | 2 refills | Status: AC

## 2020-03-21 LAB — HEPATITIS B CORE ANTIBODY, TOTAL: Hep B Core Total Ab: NONREACTIVE

## 2020-03-21 LAB — COMPREHENSIVE METABOLIC PANEL
AG Ratio: 1.7 (calc) (ref 1.0–2.5)
ALT: 56 U/L — ABNORMAL HIGH (ref 9–46)
AST: 31 U/L (ref 10–40)
Albumin: 4.3 g/dL (ref 3.6–5.1)
Alkaline phosphatase (APISO): 54 U/L (ref 36–130)
BUN: 22 mg/dL (ref 7–25)
CO2: 27 mmol/L (ref 20–32)
Calcium: 9.7 mg/dL (ref 8.6–10.3)
Chloride: 103 mmol/L (ref 98–110)
Creat: 0.98 mg/dL (ref 0.60–1.35)
Globulin: 2.5 g/dL (calc) (ref 1.9–3.7)
Glucose, Bld: 75 mg/dL (ref 65–139)
Potassium: 3.9 mmol/L (ref 3.5–5.3)
Sodium: 140 mmol/L (ref 135–146)
Total Bilirubin: 0.7 mg/dL (ref 0.2–1.2)
Total Protein: 6.8 g/dL (ref 6.1–8.1)

## 2020-03-21 LAB — CBC WITH DIFFERENTIAL/PLATELET
Absolute Monocytes: 481 cells/uL (ref 200–950)
Basophils Absolute: 29 cells/uL (ref 0–200)
Basophils Relative: 0.5 %
Eosinophils Absolute: 99 cells/uL (ref 15–500)
Eosinophils Relative: 1.7 %
HCT: 42.7 % (ref 38.5–50.0)
Hemoglobin: 14.8 g/dL (ref 13.2–17.1)
Lymphs Abs: 2024 cells/uL (ref 850–3900)
MCH: 30.3 pg (ref 27.0–33.0)
MCHC: 34.7 g/dL (ref 32.0–36.0)
MCV: 87.5 fL (ref 80.0–100.0)
MPV: 11.5 fL (ref 7.5–12.5)
Monocytes Relative: 8.3 %
Neutro Abs: 3167 cells/uL (ref 1500–7800)
Neutrophils Relative %: 54.6 %
Platelets: 185 10*3/uL (ref 140–400)
RBC: 4.88 10*6/uL (ref 4.20–5.80)
RDW: 13.1 % (ref 11.0–15.0)
Total Lymphocyte: 34.9 %
WBC: 5.8 10*3/uL (ref 3.8–10.8)

## 2020-03-21 LAB — QUANTIFERON-TB GOLD PLUS
Mitogen-NIL: 8.87 IU/mL
NIL: 0.04 IU/mL
QuantiFERON-TB Gold Plus: NEGATIVE
TB1-NIL: 0 IU/mL
TB2-NIL: 0.01 IU/mL

## 2020-03-21 LAB — HEPATITIS C ANTIBODY
Hepatitis C Ab: NONREACTIVE
SIGNAL TO CUT-OFF: 0.01 (ref <1.00)

## 2020-03-21 LAB — ANA: Anti Nuclear Antibody (ANA): NEGATIVE

## 2020-03-21 LAB — HEPATITIS B SURFACE ANTIGEN: Hepatitis B Surface Ag: NONREACTIVE

## 2020-03-21 LAB — HEPATITIS B SURFACE ANTIBODY, QUANTITATIVE: Hep B S AB Quant (Post): <5 m[IU]/mL — ABNORMAL LOW (ref >=10)

## 2020-03-26 ENCOUNTER — Telehealth: Payer: Self-pay | Admitting: Physician Assistant

## 2020-03-26 NOTE — Telephone Encounter (Signed)
Said he was supposed to get a call about the shots (doesn't know name of med) he's going to get for psoriasis, but hasn't heard anything. Wants to know what's going on.

## 2020-03-26 NOTE — Telephone Encounter (Signed)
Patient aware we have his labs and we can move forward on the injection tremfya .

## 2020-03-27 NOTE — Telephone Encounter (Signed)
Faxed all info to [facility] [phone] And tremfya [facility]) [phone]

## 2020-04-09 ENCOUNTER — Encounter: Payer: Self-pay | Admitting: Physician Assistant

## 2020-04-09 NOTE — Progress Notes (Addendum)
Follow-Up Visit   Subjective  Dillon Mcdonald is a 25 y.o. male who presents for the following: Procedure (FLARE ALL OVER ).   The following portions of the chart were reviewed this encounter and updated as appropriate:  Tobacco  Allergies  Meds  Problems  Med Hx  Surg Hx  Fam Hx      Objective  Well appearing patient in no apparent distress; mood and affect are within normal limits.  A full examination was performed including scalp, head, eyes, ears, nose, lips, neck, chest, axillae, abdomen, back, buttocks, bilateral upper extremities, bilateral lower extremities, hands, feet, fingers, toes, fingernails, and toenails. All findings within normal limits unless otherwise noted below.  Objective  Head - Anterior (Face), Left Forearm - Anterior, Left Forearm - Posterior, Left Hand - Anterior, Left Upper Arm - Anterior, Left Upper Arm - Posterior, Right Forearm - Anterior, Right Forearm - Posterior, Right Hand - Anterior, Right Upper Arm - Anterior, Right Upper Arm - Posterior, Scalp: Well-marginated erythematous papules/plaques with silvery scale.  Significant flare with high level of severity on the central face and scalp.No swollen joints or tenosynovitis.  Assessment & Plan  Psoriasis vulgaris (12) Head - Anterior (Face); Left Hand - Anterior; Right Hand - Anterior; Left Upper Arm - Anterior; Right Upper Arm - Anterior; Left Upper Arm - Posterior; Right Upper Arm - Posterior; Left Forearm - Anterior; Right Forearm - Anterior; Left Forearm - Posterior; Right Forearm - Posterior; Scalp  Start Tremfya paperwork due to increased severity of his psoriasis.  Comprehensive metabolic panel - Head - Anterior (Face), Left Forearm - Anterior, Left Forearm - Posterior, Left Hand - Anterior, Left Upper Arm - Anterior, Left Upper Arm - Posterior, Right Forearm - Anterior, Right Forearm - Posterior, Right Hand - Anterior, Right Upper Arm - Anterior, Right Upper Arm - Posterior, Scalp  CBC with  Differential/Platelet - Head - Anterior (Face), Left Forearm - Anterior, Left Forearm - Posterior, Left Hand - Anterior, Left Upper Arm - Anterior, Left Upper Arm - Posterior, Right Forearm - Anterior, Right Forearm - Posterior, Right Hand - Anterior, Right Upper Arm - Anterior, Right Upper Arm - Posterior, Scalp  Hepatitis B surface antibody,quantitative - Head - Anterior (Face), Left Forearm - Anterior, Left Forearm - Posterior, Left Hand - Anterior, Left Upper Arm - Anterior, Left Upper Arm - Posterior, Right Forearm - Anterior, Right Forearm - Posterior, Right Hand - Anterior, Right Upper Arm - Anterior, Right Upper Arm - Posterior, Scalp  Hepatitis B surface antigen - Head - Anterior (Face), Left Forearm - Anterior, Left Forearm - Posterior, Left Hand - Anterior, Left Upper Arm - Anterior, Left Upper Arm - Posterior, Right Forearm - Anterior, Right Forearm - Posterior, Right Hand - Anterior, Right Upper Arm - Anterior, Right Upper Arm - Posterior, Scalp  Hepatitis B core antibody, total - Head - Anterior (Face), Left Forearm - Anterior, Left Forearm - Posterior, Left Hand - Anterior, Left Upper Arm - Anterior, Left Upper Arm - Posterior, Right Forearm - Anterior, Right Forearm - Posterior, Right Hand - Anterior, Right Upper Arm - Anterior, Right Upper Arm - Posterior, Scalp  Hepatitis C antibody - Head - Anterior (Face), Left Forearm - Anterior, Left Forearm - Posterior, Left Hand - Anterior, Left Upper Arm - Anterior, Left Upper Arm - Posterior, Right Forearm - Anterior, Right Forearm - Posterior, Right Hand - Anterior, Right Upper Arm - Anterior, Right Upper Arm - Posterior, Scalp  QuantiFERON-TB Gold Plus - Head - Anterior (  Face), Left Forearm - Anterior, Left Forearm - Posterior, Left Hand - Anterior, Left Upper Arm - Anterior, Left Upper Arm - Posterior, Right Forearm - Anterior, Right Forearm - Posterior, Right Hand - Anterior, Right Upper Arm - Anterior, Right Upper Arm - Posterior,  Scalp  ANA - Head - Anterior (Face), Left Forearm - Anterior, Left Forearm - Posterior, Left Hand - Anterior, Left Upper Arm - Anterior, Left Upper Arm - Posterior, Right Forearm - Anterior, Right Forearm - Posterior, Right Hand - Anterior, Right Upper Arm - Anterior, Right Upper Arm - Posterior, Scalp  clobetasol (TEMOVATE) 0.05 % external solution - Head - Anterior (Face), Left Forearm - Anterior, Left Forearm - Posterior, Left Hand - Anterior, Left Upper Arm - Anterior, Left Upper Arm - Posterior, Right Forearm - Anterior, Right Forearm - Posterior, Right Hand - Anterior, Right Upper Arm - Anterior, Right Upper Arm - Posterior, Scalp  ketoconazole (NIZORAL) 2 % shampoo - Head - Anterior (Face), Left Forearm - Anterior, Left Forearm - Posterior, Left Hand - Anterior, Left Upper Arm - Anterior, Left Upper Arm - Posterior, Right Forearm - Anterior, Right Forearm - Posterior, Right Hand - Anterior, Right Upper Arm - Anterior, Right Upper Arm - Posterior, Scalp  fluocinonide-emollient (LIDEX-E) 0.05 % cream - Head - Anterior (Face), Left Forearm - Anterior, Left Forearm - Posterior, Left Hand - Anterior, Left Upper Arm - Anterior, Left Upper Arm - Posterior, Right Forearm - Anterior, Right Forearm - Posterior, Right Hand - Anterior, Right Upper Arm - Anterior, Right Upper Arm - Posterior, Scalp  alclomethasone (ACLOVATE) 0.05 % cream - Head - Anterior (Face), Left Forearm - Anterior, Left Forearm - Posterior, Left Hand - Anterior, Left Upper Arm - Anterior, Left Upper Arm - Posterior, Right Forearm - Anterior, Right Forearm - Posterior, Right Hand - Anterior, Right Upper Arm - Anterior, Right Upper Arm - Posterior, Scalp  Drug therapy  Other Related Procedures Comprehensive metabolic panel CBC with Differential/Platelet Hepatitis B surface antibody,quantitative Hepatitis B surface antigen Hepatitis B core antibody, total Hepatitis C antibody QuantiFERON-TB Gold Plus ANA   I, Koraima Albertsen,  PA-C, have reviewed all documentation's for this visit.  The documentation on 04/14/20 for the exam, diagnosis, procedures and orders are all accurate and complete.

## 2020-04-14 ENCOUNTER — Telehealth: Payer: Self-pay | Admitting: *Deleted

## 2020-04-14 NOTE — Addendum Note (Signed)
Addended by: Mackey Birchwood R on: 04/14/2020 11:48 AM   Modules accepted: Level of Service

## 2020-04-14 NOTE — Addendum Note (Signed)
Addended by: Mackey Birchwood R on: 04/14/2020 10:12 AM   Modules accepted: Level of Service

## 2020-04-14 NOTE — Telephone Encounter (Signed)
Prior authorization for Tremfya approved- 04/02/2020-04-01-2021.    PA # [id number]

## 2020-04-16 MED ORDER — TREMFYA 100 MG/ML ~~LOC~~ SOAJ: 100.0000 mg | SUBCUTANEOUS | 4 refills | Status: DC

## 2020-04-16 NOTE — Addendum Note (Signed)
Addended by: Johnna Acosta on: 04/16/2020 08:13 AM   Modules accepted: Orders

## 2020-04-16 NOTE — Telephone Encounter (Signed)
Per [facility] patient must fill with [facility]

## 2020-04-28 ENCOUNTER — Telehealth: Payer: Self-pay | Admitting: Physician Assistant

## 2020-04-28 NOTE — Telephone Encounter (Signed)
Called [facility] and a copay card was given for the patient to use with [facility]. ID [id number] BIN [id number] GROUP [id number] PLAN CODE PMD All was given to patient and was told to call and use copay card

## 2020-04-28 NOTE — Telephone Encounter (Signed)
Patient calling because Tremfya copay is over $2000 dollars. I informed patient that nurse will reach back out to him about financial assistance. Informed it could be 24-48 hours before he hears back from our office. Patient voiced understanding.

## 2020-09-28 IMAGING — DX DG CHEST 2V
2 series · 2 of 2 positions shown · non-contrast
Comparison: 11/03/2019

CLINICAL DATA: Shortness of breath

EXAM:
CHEST - 2 VIEW

[chest pa]
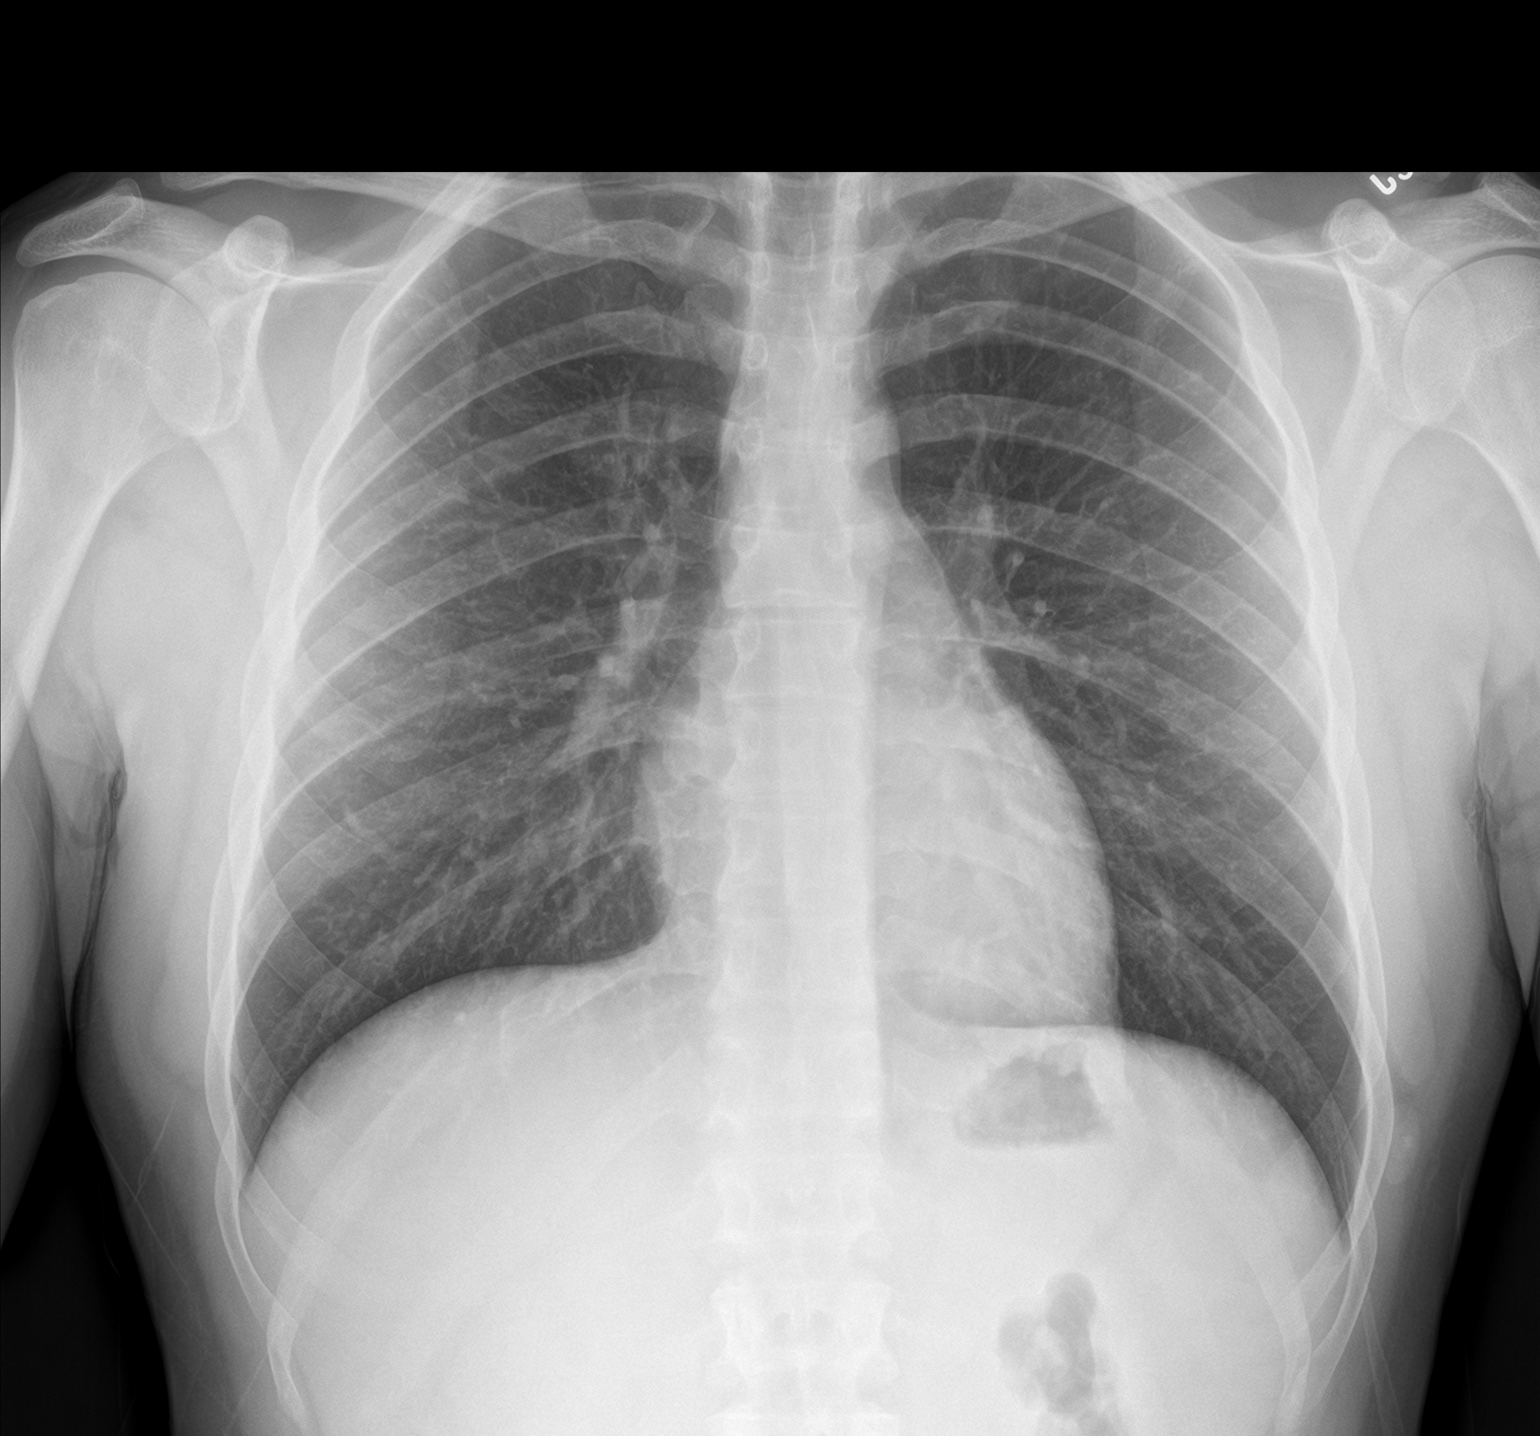

[chest lat]
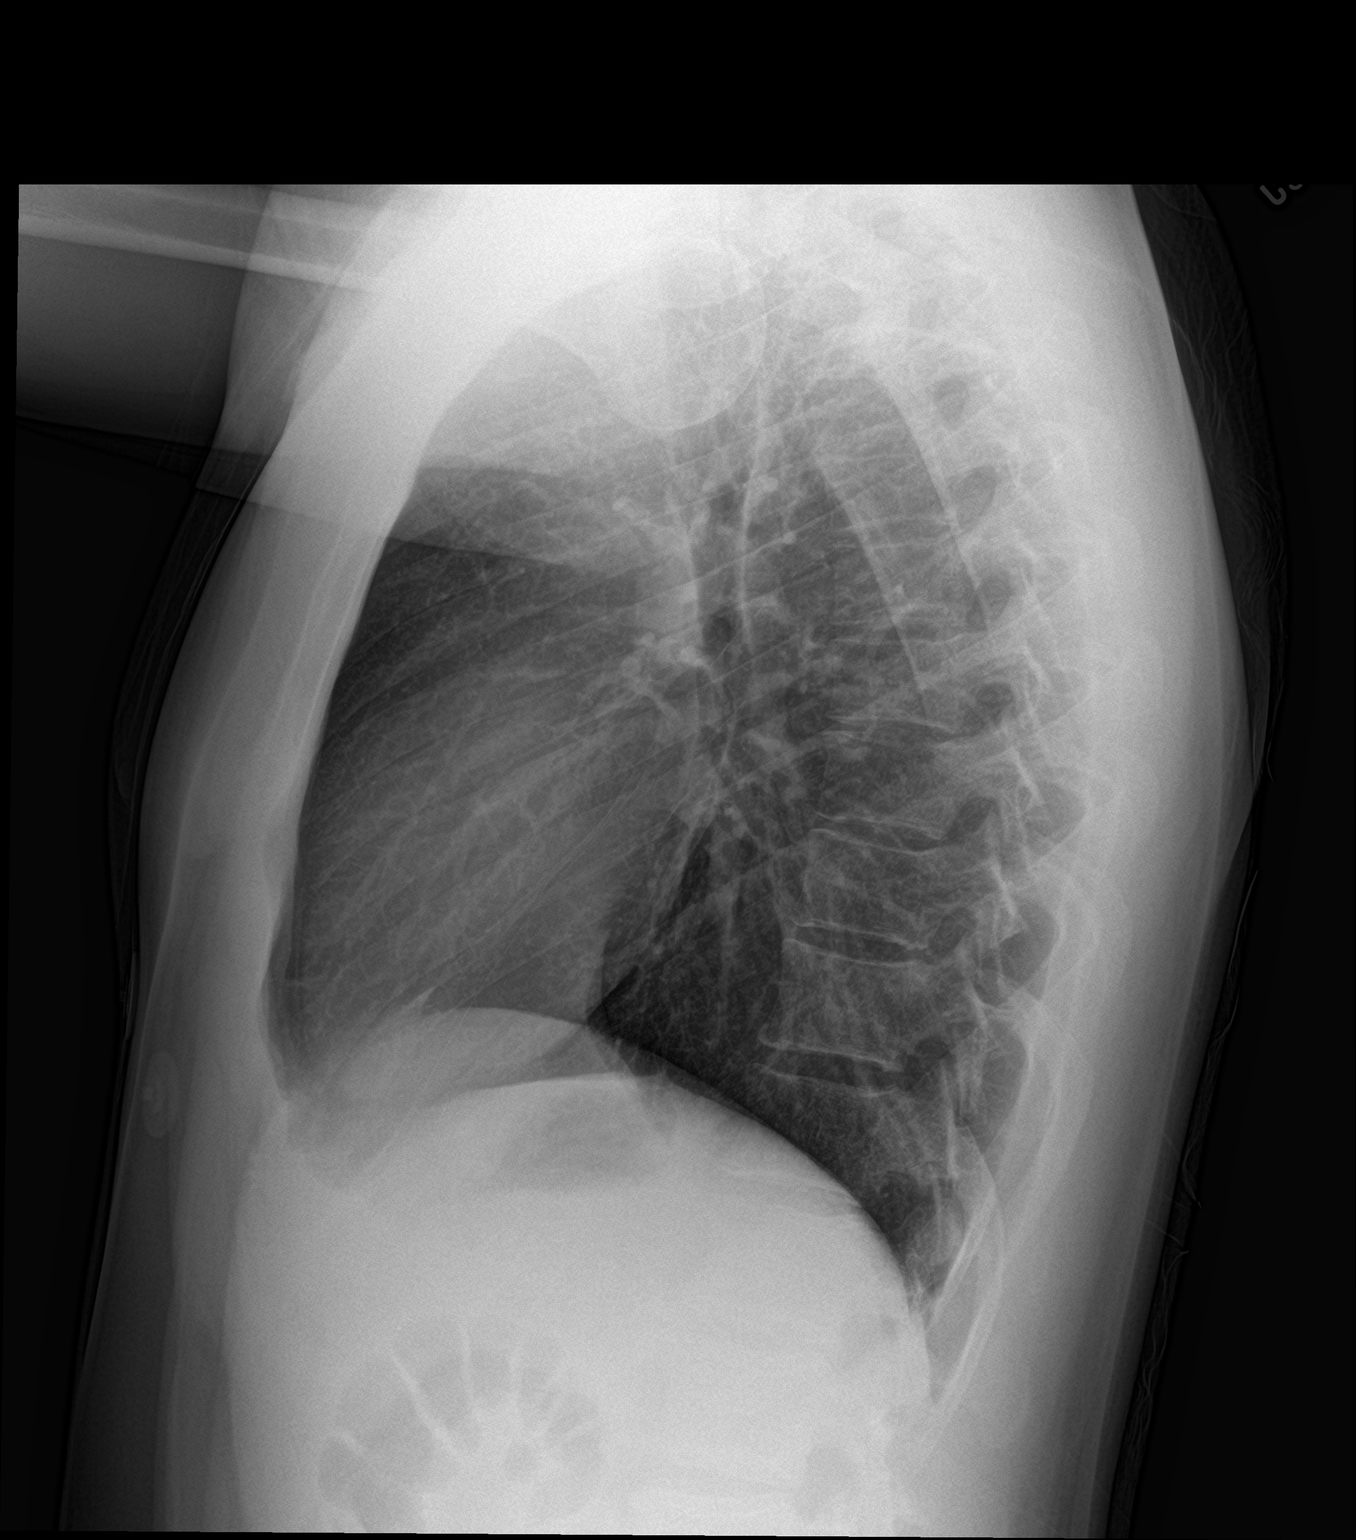

[2 of 2 positions shown; findings below may reference images not displayed]

FINDINGS: The heart size and mediastinal contours are within normal limits.
Both lungs are clear. The visualized skeletal structures are
unremarkable.
IMPRESSION: No active cardiopulmonary disease.

## 2020-11-04 ENCOUNTER — Telehealth: Payer: Self-pay | Admitting: Physician Assistant

## 2020-11-04 NOTE — Telephone Encounter (Signed)
Patient brought in new [employer] [facility] card that was entered in system  and scanned as well so that he can get his prescription for Tremfya.

## 2020-11-04 NOTE — Telephone Encounter (Signed)
Patient came came in to give [place] his new insurance card for [facility].  Patient states that he did call [facility] but the copay was too expensive so patient never got the prescription.  Samples given to patient today and new insurance sent through the [facility] portal to update patient's prescription.  Patient was advised to call in one week if he hasn't heard anything from the Pharmacy.

## 2020-11-10 ENCOUNTER — Telehealth: Payer: Self-pay | Admitting: *Deleted

## 2020-11-10 NOTE — Telephone Encounter (Signed)
Patients tremfya was approved through 11/07/20-11/06/21 per [facility].

## 2021-03-04 ENCOUNTER — Telehealth: Payer: Self-pay | Admitting: *Deleted

## 2021-03-04 NOTE — Telephone Encounter (Signed)
Fax from [employer]- stating Tremyfa approved- reference # [id number] Approval dates 02/26/21-02/25/2022.

## 2021-04-16 ENCOUNTER — Other Ambulatory Visit: Payer: Self-pay | Admitting: Dermatology

## 2021-04-20 ENCOUNTER — Telehealth: Payer: Self-pay | Admitting: Physician Assistant

## 2021-04-20 ENCOUNTER — Other Ambulatory Visit: Payer: Self-pay | Admitting: Physician Assistant

## 2021-04-20 DIAGNOSIS — L400 Psoriasis vulgaris: Secondary | ICD-10-CM

## 2021-04-20 NOTE — Telephone Encounter (Signed)
Patient medication approved- he needs office visit for labs. Patient made appointment for tomorrow.  ?

## 2021-04-20 NOTE — Telephone Encounter (Signed)
Patient left message on office voice mail that Dillon Mcdonald was not approved so he wants to know what he should do next. ?

## 2021-04-21 ENCOUNTER — Other Ambulatory Visit: Payer: Self-pay

## 2021-04-21 ENCOUNTER — Ambulatory Visit (INDEPENDENT_AMBULATORY_CARE_PROVIDER_SITE_OTHER): Payer: BC Managed Care – PPO | Admitting: Physician Assistant

## 2021-04-21 ENCOUNTER — Encounter: Payer: Self-pay | Admitting: Physician Assistant

## 2021-04-21 DIAGNOSIS — L400 Psoriasis vulgaris: Secondary | ICD-10-CM

## 2021-04-21 DIAGNOSIS — R5383 Other fatigue: Secondary | ICD-10-CM | POA: Diagnosis not present

## 2021-04-21 DIAGNOSIS — Z79899 Other long term (current) drug therapy: Secondary | ICD-10-CM | POA: Diagnosis not present

## 2021-04-21 DIAGNOSIS — L219 Seborrheic dermatitis, unspecified: Secondary | ICD-10-CM | POA: Diagnosis not present

## 2021-04-21 MED ORDER — KETOCONAZOLE 2 % EX SHAM: MEDICATED_SHAMPOO | CUTANEOUS | 8 refills | Status: AC

## 2021-04-21 MED ORDER — CLOBETASOL PROPIONATE 0.05 % EX SOLN: CUTANEOUS | 2 refills | Status: AC

## 2021-04-21 MED ORDER — ALCLOMETASONE DIPROPIONATE 0.05 % EX CREA: TOPICAL_CREAM | CUTANEOUS | 8 refills | Status: AC

## 2021-04-21 NOTE — Progress Notes (Signed)
? ?  Follow-Up Visit ?  ?Subjective  ?Dillon Mcdonald is a 26 y.o. male who presents for the following: Psoriasis (Patient need refills for tremfya ketoconazole shampoo alclometasone ointment clobetasol solution. Current flare of psoriasis scalp, and face starting to flare. Patient is due for a injection this week and has tremfya at home. Patient aware TB due and no refills for tremfya until this is done, patient stated he is off his Paxil now that his skin is clear. He is really happy. ? ? ?The following portions of the chart were reviewed this encounter and updated as appropriate:  Tobacco  Allergies  Meds  Problems  Med Hx  Surg Hx  Fam Hx   ?  ? ?Objective  ?Well appearing patient in no apparent distress; mood and affect are within normal limits. ? ?A full examination was performed including scalp, head, eyes, ears, nose, lips, neck, chest, axillae, abdomen, back, buttocks, bilateral upper extremities, bilateral lower extremities, hands, feet, fingers, toes, fingernails, and toenails. All findings within normal limits unless otherwise noted below. ? ?total body ?Post inflammatory hyperpigmentation- central face. Otherwise clear.  ?No signs of arthritis. ? ?central facial ?Central facial erythema ? ? ?Assessment & Plan  ?Plaque psoriasis ?total body ? ?QuantiFERON-TB Gold Plus - total body ? ?Drug therapy ? ?Related Procedures ?QuantiFERON-TB Gold Plus ? ?Other fatigue ? ?Related Procedures ?QuantiFERON-TB Gold Plus ? ?Psoriasis vulgaris ? ?Related Medications ?ketoconazole (NIZORAL) 2 % shampoo ?Apply 1 application topically 2 (two) times a week. ? ?fluocinonide-emollient (LIDEX-E) 0.05 % cream ?Apply 1 application topically 2 (two) times daily. ? ?alclomethasone (ACLOVATE) 0.05 % cream ?Apply 1 application topically 2 (two) times daily. ? ?clobetasol (TEMOVATE) 0.05 % external solution ?APPLY TO THE AFFECTED AREA EVERY DAY TO TWICE DAILY for scalp flare ? ?Seborrheic dermatitis ?central facial ? ?ketoconazole  (NIZORAL) 2 % shampoo - central facial ?APPLY TOPICALLY 2 TIMES A WEEK ? ?alclomethasone (ACLOVATE) 0.05 % cream - central facial ?APPLY TOPICALLY TO THE AFFECTED AREA TWICE DAILY ? ? ? ?I, Vella Colquitt, PA-C, have reviewed all documentation's for this visit.  The documentation on 04/21/21 for the exam, diagnosis, procedures and orders are all accurate and complete. ?

## 2021-04-25 LAB — QUANTIFERON-TB GOLD PLUS
Mitogen-NIL: 8.13 IU/mL
NIL: 0.03 IU/mL
QuantiFERON-TB Gold Plus: NEGATIVE
TB1-NIL: 0 IU/mL
TB2-NIL: 0 IU/mL

## 2021-05-08 ENCOUNTER — Other Ambulatory Visit: Payer: Self-pay | Admitting: Dermatology

## 2021-05-11 NOTE — Telephone Encounter (Signed)
Refill approved.

## 2021-08-19 ENCOUNTER — Encounter: Payer: Self-pay | Admitting: Emergency Medicine

## 2021-08-19 ENCOUNTER — Other Ambulatory Visit: Payer: Self-pay

## 2021-08-19 ENCOUNTER — Ambulatory Visit
Admission: EM | Admit: 2021-08-19 | Discharge: 2021-08-19 | Disposition: A | Discharge status: Home / Self Care | Payer: BC Managed Care – PPO | Attending: Nurse Practitioner | Admitting: Nurse Practitioner

## 2021-08-19 DIAGNOSIS — H109 Unspecified conjunctivitis: Secondary | ICD-10-CM | POA: Diagnosis not present

## 2021-08-19 MED ORDER — ERYTHROMYCIN 5 MG/GM OP OINT: TOPICAL_OINTMENT | Freq: Four times a day (QID) | OPHTHALMIC | 0 refills | Status: AC

## 2021-08-19 NOTE — ED Provider Notes (Signed)
[facility]    CSN: [id number] Arrival date & time: 08/19/21  1114      History   Chief Complaint Chief Complaint  Patient presents with   Eye Problem    HPI Dillon Mcdonald is a 26 y.o. male.   Patient presents with right eye redness, drainage, and irritation for the past day.  Reports he woke up this morning with a lot of drainage/exudate on his eyelashes.  Denies recent cough, fever, congestion, sore throat.  He also denies vision changes, headache, floaters in his vision.  Does not wear contacts.  Reports the drainage sometimes makes "squiggles" in his vision, however it is not blurred or double.  Reports he has a 24 year old child that attends daycare; they are not having any symptoms currently.      Past Medical History:  Diagnosis Date   Psoriasis     There are no problems to display for this patient.   History reviewed. No pertinent surgical history.     Home Medications    Prior to Admission medications   Medication Sig Start Date End Date Taking? Authorizing Provider  erythromycin ophthalmic ointment Place into the right eye 4 (four) times daily for 7 days. Place a 1/2 inch ribbon of ointment into the lower eyelid. 08/19/21 08/26/21 Yes Valentino Nose, NP  alclomethasone (ACLOVATE) 0.05 % cream APPLY TOPICALLY TO THE AFFECTED AREA TWICE DAILY 04/21/21   Sheffield, Harvin Hazel R, PA-C  clobetasol (TEMOVATE) 0.05 % external solution APPLY TO THE AFFECTED AREA EVERY DAY TO TWICE DAILY for scalp flare 04/21/21   Mackey Birchwood R, PA-C  fluocinonide cream (LIDEX) 0.05 % Apply 1 application topically 2 (two) times daily.  Patient not taking: Reported on 04/21/2021 02/21/18   [provider]  fluocinonide-emollient (LIDEX-E) 0.05 % cream Apply 1 application topically 2 (two) times daily. Patient not taking: Reported on 04/21/2021 03/19/20   Glyn Ade, PA-C  ibuprofen (ADVIL,MOTRIN) 600 MG tablet Take 1 tablet (600 mg total) by mouth 4 (four) times  daily. 10/03/17   Ivery Quale, PA-C  ketoconazole (NIZORAL) 2 % shampoo APPLY TOPICALLY 2 TIMES A WEEK 04/21/21   Sheffield, Harvin Hazel R, PA-C  naproxen (NAPROSYN) 500 MG tablet Take 1 tablet (500 mg total) by mouth 2 (two) times daily with a meal. Patient not taking: Reported on 04/21/2021 03/11/18   Eber Hong, MD  PARoxetine (PAXIL) 20 MG tablet Take 20 mg by mouth every morning. Patient not taking: Reported on 04/21/2021 03/10/20   [provider]  TREMFYA 100 MG/ML SOPN INJECT 100MG  SUBCUTANEOUSLY EVERY 8 WEEKS AS DIRECTED. 05/11/21   07/11/21, MD    Family History History reviewed. No pertinent family history.  Social History Social History   Tobacco Use   Smoking status: Former   Smokeless tobacco: Current  Vaping Use   Vaping Use: Former  Substance Use Topics   Alcohol use: Yes    Alcohol/week: 6.0 standard drinks of alcohol    Types: 6 Cans of beer per week    Comment: every other day    Drug use: Not Currently     Allergies   Patient has no known allergies.   Review of Systems Review of Systems Per HPI  Physical Exam Triage Vital Signs ED Triage Vitals [08/19/21 1203]  Enc Vitals Group     BP (!) 146/91     Pulse Rate 70     Resp 18     Temp 98.3 F (36.8 C)  Temp Source Oral     SpO2 96 %     Weight      Height      Head Circumference      Peak Flow      Pain Score 0     Pain Loc      Pain Edu?      Excl. in GC?    No data found.  Updated Vital Signs BP (!) 146/91 (BP Location: Right Arm)   Pulse 70   Temp 98.3 F (36.8 C) (Oral)   Resp 18   SpO2 96%   Visual Acuity Right Eye Distance:   Left Eye Distance:   Bilateral Distance:    Right Eye Near:   Left Eye Near:    Bilateral Near:     Physical Exam Vitals and nursing note reviewed.  Constitutional:      General: He is not in acute distress.    Appearance: Normal appearance. He is not toxic-appearing.  HENT:     Head: Normocephalic and atraumatic.     Nose: Nose  normal. No congestion.     Mouth/Throat:     Mouth: Mucous membranes are moist.     Pharynx: Oropharynx is clear.  Eyes:     General: No scleral icterus.       Right eye: Discharge present. No hordeolum.        Left eye: No discharge or hordeolum.     Extraocular Movements: Extraocular movements intact.     Right eye: Normal extraocular motion.     Left eye: Normal extraocular motion.     Conjunctiva/sclera:     Right eye: Right conjunctiva is injected. Exudate present.     Left eye: Left conjunctiva is not injected. No exudate.    Pupils: Pupils are equal, round, and reactive to light.  Pulmonary:     Effort: Pulmonary effort is normal. No respiratory distress.  Skin:    General: Skin is warm and dry.     Capillary Refill: Capillary refill takes less than 2 seconds.     Coloration: Skin is not jaundiced or pale.     Findings: No erythema.  Neurological:     Mental Status: He is alert and oriented to person, place, and time.  Psychiatric:        Behavior: Behavior is cooperative.      UC Treatments / Results  Labs (all labs ordered are listed, but only abnormal results are displayed) Labs Reviewed - No data to display  EKG   Radiology No results found.  Procedures Procedures (including critical care time)  Medications Ordered in UC Medications - No data to display  Initial Impression / Assessment and Plan / UC Course  I have reviewed the triage vital signs and the nursing notes.  Pertinent labs & imaging results that were available during my care of the patient were reviewed by me and considered in my medical decision making (see chart for details).    Patient is a very pleasant, well appearing 26 year old male presenting with right eye redness and discharge without viral symptoms.  Treat bacterial conjunctivitis with erythromycin ointment four times daily for 7 days.  Discussed eye care and follow up with speciality if symptoms persist or worsen despite  treatment.  The patient was given the opportunity to ask questions.  All questions answered to their satisfaction.  The patient is in agreement to this plan.   Final Clinical Impressions(s) / UC Diagnoses   Final diagnoses:  Bacterial conjunctivitis     Discharge Instructions      - Start erythromycin eye ointment to clear up bacteria in your right eye -Make sure to use good hand hygiene, change sheets and pillowcases after starting treatment -You are considered contagious until you have been on antibiotics 24 hours -Seek care early next week if symptoms persist despite treatment    ED Prescriptions     Medication Sig Dispense Auth. Provider   erythromycin ophthalmic ointment Place into the right eye 4 (four) times daily for 7 days. Place a 1/2 inch ribbon of ointment into the lower eyelid. 3.5 g Valentino Nose, NP      PDMP not reviewed this encounter.   Valentino Nose, NP 08/19/21 1303

## 2021-08-19 NOTE — Discharge Instructions (Signed)
-   Start erythromycin eye ointment to clear up bacteria in your right eye -Make sure to use good hand hygiene, change sheets and pillowcases after starting treatment -You are considered contagious until you have been on antibiotics 24 hours -Seek care early next week if symptoms persist despite treatment

## 2021-08-19 NOTE — ED Triage Notes (Signed)
Pt reports right eye drainage, irritation, redness since yesterday.

## 2021-08-20 ENCOUNTER — Other Ambulatory Visit: Payer: Self-pay | Admitting: Dermatology

## 2021-11-03 ENCOUNTER — Ambulatory Visit
Admission: EM | Admit: 2021-11-03 | Discharge: 2021-11-03 | Disposition: A | Discharge status: Home / Self Care | Payer: BC Managed Care – PPO | Attending: Nurse Practitioner | Admitting: Nurse Practitioner

## 2021-11-03 ENCOUNTER — Encounter: Payer: Self-pay | Admitting: Emergency Medicine

## 2021-11-03 DIAGNOSIS — Z1152 Encounter for screening for COVID-19: Secondary | ICD-10-CM | POA: Diagnosis present

## 2021-11-03 DIAGNOSIS — J069 Acute upper respiratory infection, unspecified: Secondary | ICD-10-CM | POA: Insufficient documentation

## 2021-11-03 DIAGNOSIS — R0989 Other specified symptoms and signs involving the circulatory and respiratory systems: Secondary | ICD-10-CM | POA: Diagnosis not present

## 2021-11-03 DIAGNOSIS — R059 Cough, unspecified: Secondary | ICD-10-CM | POA: Diagnosis present

## 2021-11-03 LAB — RESP PANEL BY RT-PCR (FLU A&B, COVID) ARPGX2
Influenza A by PCR: NEGATIVE
Influenza B by PCR: NEGATIVE
SARS Coronavirus 2 by RT PCR: NEGATIVE

## 2021-11-03 MED ORDER — CETIRIZINE-PSEUDOEPHEDRINE ER 5-120 MG PO TB12: 1.0000 | ORAL_TABLET | Freq: Every day | ORAL | 0 refills | Status: AC

## 2021-11-03 MED ORDER — PROMETHAZINE-DM 6.25-15 MG/5ML PO SYRP: 5.0000 mL | ORAL_SOLUTION | Freq: Four times a day (QID) | ORAL | 0 refills | Status: AC | PRN

## 2021-11-03 MED ORDER — FLUTICASONE PROPIONATE 50 MCG/ACT NA SUSP: 2.0000 | Freq: Every day | NASAL | 0 refills | Status: AC

## 2021-11-03 NOTE — ED Triage Notes (Signed)
Headache, cough, states he was unable to taste on Sunday.  All symptoms started on Sunday.  Has been taking allergy medications without relief.

## 2021-11-03 NOTE — Discharge Instructions (Addendum)
COVID/flu test is pending at this time. You will be contacted if the results of your test are positive. As discussed, you are a candidate to receive Paxlovid as an antiviral.  Take medication as prescribed. Increase fluids and allow for plenty of rest. Recommend Tylenol or ibuprofen as needed for pain, fever, or general discomfort. Warm salt water gargles 3-4 times daily to help with throat pain or discomfort. Recommend using a humidifier at bedtime during sleep to help with cough and nasal congestion. Sleep elevated on 2 pillows while cough symptoms persist. Follow-up if your symptoms fail to improve or they suddenly worsen.

## 2021-11-03 NOTE — ED Provider Notes (Signed)
[facility]    CSN: [id number] Arrival date & time: 11/03/21  0806      History   Chief Complaint No chief complaint on file.   HPI Dillon Mcdonald is a 26 y.o. male.   The history is provided by the patient.   Patient presents with a 4-day history of fatigue, headache, nasal congestion, runny nose, cough, and bilateral ear fullness.  Patient states that he felt his symptoms coming on, but states that he developed them full-blown 2 days ago.  He continues to experience a persistent cough.  He states cough is productive of clear sputum.  He also states that he has not been able to smell anything since his symptoms started.  He states that his taste is also decreased.  Patient denies fever, chills, ear drainage, wheezing, shortness of breath, difficulty breathing, or GI symptoms.  Patient states that he does have a history of psoriasis and takes Tremfya for his symptoms.  States that he received an injection earlier last week prior to his symptoms starting.  He has been taking over-the-counter allergy medications with minimal relief.    Past Medical History:  Diagnosis Date   Psoriasis     There are no problems to display for this patient.   History reviewed. No pertinent surgical history.     Home Medications    Prior to Admission medications   Medication Sig Start Date End Date Taking? Authorizing Provider  cetirizine-pseudoephedrine (ZYRTEC-D) 5-120 MG tablet Take 1 tablet by mouth daily. 11/03/21  Yes Avonna Iribe-Warren, Sadie Haber, NP  fluticasone (FLONASE) 50 MCG/ACT nasal spray Place 2 sprays into both nostrils daily. 11/03/21  Yes Marquise Wicke-Warren, Sadie Haber, NP  promethazine-dextromethorphan (PROMETHAZINE-DM) 6.25-15 MG/5ML syrup Take 5 mLs by mouth 4 (four) times daily as needed for cough. 11/03/21  Yes Leary Mcnulty-Warren, Sadie Haber, NP  alclomethasone (ACLOVATE) 0.05 % cream APPLY TOPICALLY TO THE AFFECTED AREA TWICE DAILY 04/21/21   Sheffield, Harvin Hazel R, PA-C  clobetasol  (TEMOVATE) 0.05 % external solution APPLY TO THE AFFECTED AREA EVERY DAY TO TWICE DAILY for scalp flare 04/21/21   Mackey Birchwood R, PA-C  fluocinonide cream (LIDEX) 0.05 % Apply 1 application topically 2 (two) times daily.  Patient not taking: Reported on 04/21/2021 02/21/18   [provider]  fluocinonide-emollient (LIDEX-E) 0.05 % cream Apply 1 application topically 2 (two) times daily. Patient not taking: Reported on 04/21/2021 03/19/20   Glyn Ade, PA-C  ibuprofen (ADVIL,MOTRIN) 600 MG tablet Take 1 tablet (600 mg total) by mouth 4 (four) times daily. 10/03/17   Ivery Quale, PA-C  ketoconazole (NIZORAL) 2 % shampoo APPLY TOPICALLY 2 TIMES A WEEK 04/21/21   Sheffield, Harvin Hazel R, PA-C  naproxen (NAPROSYN) 500 MG tablet Take 1 tablet (500 mg total) by mouth 2 (two) times daily with a meal. Patient not taking: Reported on 04/21/2021 03/11/18   Eber Hong, MD  PARoxetine (PAXIL) 20 MG tablet Take 20 mg by mouth every morning. Patient not taking: Reported on 04/21/2021 03/10/20   [provider]  TREMFYA 100 MG/ML SOPN INJECT 100MG  SUBCUTANEOUSLY EVERY 8 WEEKS AS DIRECTED. 08/20/21   08/22/21, MD    Family History History reviewed. No pertinent family history.  Social History Social History   Tobacco Use   Smoking status: Former   Smokeless tobacco: Current  Vaping Use   Vaping Use: Former  Substance Use Topics   Alcohol use: Yes    Alcohol/week: 6.0 standard drinks of alcohol    Types: 6 Cans of  beer per week    Comment: every other day    Drug use: Not Currently     Allergies   Patient has no known allergies.   Review of Systems Review of Systems Per HPI  Physical Exam Triage Vital Signs ED Triage Vitals  Enc Vitals Group     BP 11/03/21 0816 (!) 175/90     Pulse Rate 11/03/21 0816 83     Resp 11/03/21 0816 16     Temp 11/03/21 0816 98.5 F (36.9 C)     Temp Source 11/03/21 0816 Oral     SpO2 11/03/21 0816 99 %     Weight --      Height  --      Head Circumference --      Peak Flow --      Pain Score 11/03/21 0818 6     Pain Loc --      Pain Edu? --      Excl. in GC? --    No data found.  Updated Vital Signs BP (!) 175/90 (BP Location: Right Arm)   Pulse 83   Temp 98.5 F (36.9 C) (Oral)   Resp 16   SpO2 99%   Visual Acuity Right Eye Distance:   Left Eye Distance:   Bilateral Distance:    Right Eye Near:   Left Eye Near:    Bilateral Near:     Physical Exam Vitals and nursing note reviewed.  Constitutional:      General: He is not in acute distress.    Appearance: Normal appearance.  HENT:     Head: Normocephalic.     Right Ear: Tympanic membrane, ear canal and external ear normal.     Left Ear: Tympanic membrane, ear canal and external ear normal.     Nose: Congestion present.     Right Turbinates: Enlarged and swollen.     Left Turbinates: Enlarged and swollen.     Right Sinus: No maxillary sinus tenderness or frontal sinus tenderness.     Left Sinus: No maxillary sinus tenderness or frontal sinus tenderness.     Mouth/Throat:     Lips: Pink.     Mouth: Mucous membranes are moist.     Pharynx: Pharyngeal swelling and posterior oropharyngeal erythema present. No uvula swelling.     Tonsils: 1+ on the right. 0 on the left.  Eyes:     Extraocular Movements: Extraocular movements intact.     Conjunctiva/sclera: Conjunctivae normal.     Pupils: Pupils are equal, round, and reactive to light.  Cardiovascular:     Rate and Rhythm: Normal rate and regular rhythm.  Pulmonary:     Effort: Pulmonary effort is normal. No respiratory distress.     Breath sounds: Normal breath sounds. No stridor. No wheezing, rhonchi or rales.  Abdominal:     General: Bowel sounds are normal.     Palpations: Abdomen is soft.     Tenderness: There is no abdominal tenderness.  Musculoskeletal:     Cervical back: Normal range of motion.  Lymphadenopathy:     Cervical: No cervical adenopathy.  Skin:    General: Skin is  warm and dry.  Neurological:     General: No focal deficit present.     Mental Status: He is alert and oriented to person, place, and time.  Psychiatric:        Mood and Affect: Mood normal.        Behavior: Behavior normal.  UC Treatments / Results  Labs (all labs ordered are listed, but only abnormal results are displayed) Labs Reviewed  RESP PANEL BY RT-PCR (FLU A&B, COVID) [id number]    EKG   Radiology No results found.  Procedures Procedures (including critical care time)  Medications Ordered in UC Medications - No data to display  Initial Impression / Assessment and Plan / UC Course  I have reviewed the triage vital signs and the nursing notes.  Pertinent labs & imaging results that were available during my care of the patient were reviewed by me and considered in my medical decision making (see chart for details).  Patient presents for upper respiratory symptoms that been present for the past 3 to 4 days.  On exam, patient's vital signs are stable, although he is hypertensive, this is most likely contributed to the medications he has been taking.  His exam is otherwise benign.  Lung sounds are clear throughout.  Symptoms appear to be viral upper respiratory infection with a cough at this time; however, COVID/flu test is pending.  If the patient's COVID test is positive, he is a candidate to receive Paxlovid.  Supportive care recommendations were provided to the patient along with symptomatic treatment to include Promethazine DM, fluticasone, and Zyrtec-D.  Patient was advised that he will be contacted if his pending test results are positive.  Patient was given strict indications of when to follow-up. Patient verbalizes understanding.  All questions were answered.   Final Clinical Impressions(s) / UC Diagnoses   Final diagnoses:  Encounter for screening for COVID-19  Symptoms of upper respiratory infection (URI)  Viral upper respiratory tract infection with cough   Cough, unspecified type     Discharge Instructions      COVID/flu test is pending at this time. You will be contacted if the results of your test are positive. As discussed, you are a candidate to receive Paxlovid as an antiviral.  Take medication as prescribed. Increase fluids and allow for plenty of rest. Recommend Tylenol or ibuprofen as needed for pain, fever, or general discomfort. Warm salt water gargles 3-4 times daily to help with throat pain or discomfort. Recommend using a humidifier at bedtime during sleep to help with cough and nasal congestion. Sleep elevated on 2 pillows while cough symptoms persist. Follow-up if your symptoms fail to improve or they suddenly worsen.     ED Prescriptions     Medication Sig Dispense Auth. Provider   promethazine-dextromethorphan (PROMETHAZINE-DM) 6.25-15 MG/5ML syrup Take 5 mLs by mouth 4 (four) times daily as needed for cough. 140 mL Stefanny Pieri-Warren, Alda Lea, NP   fluticasone (FLONASE) 50 MCG/ACT nasal spray Place 2 sprays into both nostrils daily. 16 g Cheria Sadiq-Warren, Alda Lea, NP   cetirizine-pseudoephedrine (ZYRTEC-D) 5-120 MG tablet Take 1 tablet by mouth daily. 30 tablet Keyshun Elpers-Warren, Alda Lea, NP      PDMP not reviewed this encounter.   Tish Men, NP 11/03/21 [phone]

## 2022-04-29 ENCOUNTER — Ambulatory Visit: Payer: BC Managed Care – PPO | Admitting: Physician Assistant

## 2022-09-15 NOTE — Telephone Encounter (Signed)
 Patient dermatology has closed will fill medication until he can est care with new provider.  He has scheduled appointment with onsite clinic on 7/21 however he is needing refills prior to this appointment

## 2022-09-15 NOTE — Progress Notes (Signed)
 Workers Comp Follow-up Visit:   Date of Injury: 09/07/22   Patient's Employer Name: [employer]  Last office visit date: 09/07/22  Total office visits for this injury (not including today): 1  Briefly describe the injury: : Dropped a motor on his left hand- dorsum.  Has not done anything for it. Came here. Painful and swollen esp over 3rd and 4th digits.  The fourth digit at PIP is especially painful.  Tetanus up to date.   Today F/U 09/15/22-  Pain has improved, he has been working with restriction without difficulty.  ROM is improving as well   Treatment rendered thus far: Rest, ICE, Buddy splinted   Is the treatment improving the injury: Yes  Current work restrictions: no use of left hand  Is patient tolerating work restrictions: yes  Percent improvement overall from the date of injury (0-100%): 80  Referrals completed/pending: no  Allergy and Medication history documented by clinical staff have been reviewed with the patient and I find no changes 09/15/2022  Past, Family and Social History documented by the clinical staff have been reviewed with the patient and I find no changes 09/15/2022   There were no vitals filed for this visit.  Physical Exam Cardiovascular:     Rate and Rhythm: Normal rate and regular rhythm.     Pulses: Normal pulses.     Heart sounds: Normal heart sounds.  Pulmonary:     Effort: Pulmonary effort is normal.     Breath sounds: Normal breath sounds.  Musculoskeletal:       Arms:            Assessment/Plan   Problem List Items Addressed This Visit   None Visit Diagnoses     Encounter for examination and observation following work accident    -  Primary   Contusion of left hand, subsequent encounter             Treatment Date of Injury: 09/07/22 Patient's Employer Name: [employer]      Return to Work : Functional Capacity Can return to work? : With modifications Return to Work Date: 09/15/22 Affected Body Parts: Hand Body Part Laterality:  Left Modifications: Alternate repetitive motion and non-repetitive motion activities, No forceful grasping No lifting/pulling/pushing over (lbs/kg): 4.536 kg (10 lb)  Restrictions No lifting/pulling/pushing over (lbs/kg): 4.536 kg (10 lb)  Accident/Injury Causation Causation: Work related  Re-Evaluation Follow-up on : 09/29/22 Follow-up with:: [facility]

## 2022-10-15 NOTE — Progress Notes (Signed)
 Workers Comp Follow-up Visit:  Date of Injury: 09/07/22   Patient's Employer Name: [employer]  Job title:  Maintenance   Length of employment with current employer:   9 Months  Last office visit date:   09/15/22  INITIAL:Dropped a motor on his left hand- dorsum.  Has not done anything for it. Came here. Painful and swollen esp over 3rd and 4th digits.  The fourth digit at PIP is especially painful.  Tetanus up to date.    TODAY(10/13/2022):  pain has resolved, Full ROM    Treatment rendered thus far:  Rest, ICE, Buddy splinted   Is the treatment improving the injury:  yes  Current work restrictions and tolerating:  10 lbs retsrictions  Percent improvement overall from the date of injury (0-100%):  100  Referrals completed/pending:  no  Allergy and Medication history documented by clinical staff have been reviewed with the patient and I find no changes 10/15/2022  Past, Family and Social History documented by the clinical staff have been reviewed with the patient and I find no changes 10/15/2022  There were no vitals filed for this visit.  Physical Exam Musculoskeletal:     Right hand: Normal.     Left hand: Normal.           Assessment 1. Encounter for examination and observation following work accident  2. Contusion of left hand, subsequent encounter   Plan Work Restrictions and Follow Up  MMI MMI Achieved?: Yes MMI Achieved Date: 10/13/22 Return to Work : Functional Capacity Can return to work? : Without modifications (Released from care) Return to Work Date: 10/13/22   Accident/Injury Causation Causation: Work related          Additional comments: Released from care  Carlyon Earnie Rainbow, FNP

## 2023-09-08 ENCOUNTER — Encounter (HOSPITAL_COMMUNITY): Payer: Self-pay

## 2023-09-08 ENCOUNTER — Emergency Department (HOSPITAL_COMMUNITY)
Admission: EM | Admit: 2023-09-08 | Discharge: 2023-09-08 | Disposition: A | Discharge status: Home / Self Care | Payer: Self-pay | Attending: Emergency Medicine | Admitting: Emergency Medicine

## 2023-09-08 ENCOUNTER — Other Ambulatory Visit: Payer: Self-pay

## 2023-09-08 DIAGNOSIS — H60503 Unspecified acute noninfective otitis externa, bilateral: Secondary | ICD-10-CM | POA: Insufficient documentation

## 2023-09-08 MED ORDER — HYDROCODONE-ACETAMINOPHEN 5-325 MG PO TABS
2.0000 | ORAL_TABLET | Freq: Once | ORAL | Status: AC
  Administered 2023-09-08: 2 via ORAL
  Filled 2023-09-08: qty 2

## 2023-09-08 MED ORDER — NEOMYCIN-POLYMYXIN-HC 3.5-10000-1 OT SUSP: 4.0000 [drp] | Freq: Four times a day (QID) | OTIC | 0 refills | Status: AC

## 2023-09-08 MED ORDER — HYDROCODONE-ACETAMINOPHEN 5-325 MG PO TABS: 1.0000 | ORAL_TABLET | Freq: Four times a day (QID) | ORAL | 0 refills | Status: AC | PRN

## 2023-09-08 MED ORDER — NEOMYCIN-POLYMYXIN-HC 3.5-10000-1 OT SUSP: 4.0000 [drp] | Freq: Four times a day (QID) | OTIC | 0 refills | Status: DC

## 2023-09-08 NOTE — ED Provider Notes (Signed)
 [place] [facility] AT [facility] Provider Note   CSN: [id number] Arrival date & time: 09/08/23  9890     Patient presents with: Otalgia   Dillon Mcdonald is a 28 y.o. male.   Patient is a 28 year old male with no significant past medical history.  Patient presenting with bilateral ear pain and drainage worsening over the past several days.  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  Hearing is somewhat muffled in both ears.       Prior to Admission medications   Medication Sig Start Date End Date Taking? Authorizing Provider  alclomethasone (ACLOVATE ) 0.05 % cream APPLY TOPICALLY TO THE AFFECTED AREA TWICE DAILY 04/21/21   Sheffield, Kelli R, PA-C  cetirizine -pseudoephedrine  (ZYRTEC -D) 5-120 MG tablet Take 1 tablet by mouth daily. 11/03/21   Leath-Warren, Etta PARAS, NP  clobetasol  (TEMOVATE ) 0.05 % external solution APPLY TO THE AFFECTED AREA EVERY DAY TO TWICE DAILY for scalp flare 04/21/21   Sheffield, Kelli R, PA-C  fluocinonide  cream (LIDEX ) 0.05 % Apply 1 application topically 2 (two) times daily.  Patient not taking: Reported on 04/21/2021 02/21/18   [provider]  fluocinonide -emollient (LIDEX -E) 0.05 % cream Apply 1 application topically 2 (two) times daily. Patient not taking: Reported on 04/21/2021 03/19/20   Sheffield, Kelli R, PA-C  fluticasone  (FLONASE ) 50 MCG/ACT nasal spray Place 2 sprays into both nostrils daily. 11/03/21   Leath-Warren, Etta PARAS, NP  ibuprofen  (ADVIL ,MOTRIN ) 600 MG tablet Take 1 tablet (600 mg total) by mouth 4 (four) times daily. 10/03/17   Armida Culver, PA-C  ketoconazole  (NIZORAL ) 2 % shampoo APPLY TOPICALLY 2 TIMES A WEEK 04/21/21   Sheffield, Kelli R, PA-C  naproxen  (NAPROSYN ) 500 MG tablet Take 1 tablet (500 mg total) by mouth 2 (two) times daily with a meal. Patient not taking: Reported on 04/21/2021 03/11/18   Cleotilde Rogue, MD  PARoxetine (PAXIL) 20 MG tablet  Take 20 mg by mouth every morning. Patient not taking: Reported on 04/21/2021 03/10/20   [provider]  promethazine -dextromethorphan (PROMETHAZINE -DM) 6.25-15 MG/5ML syrup Take 5 mLs by mouth 4 (four) times daily as needed for cough. 11/03/21   Leath-Warren, Etta PARAS, NP  TREMFYA  100 MG/ML SOPN INJECT 100MG  SUBCUTANEOUSLY EVERY 8 WEEKS AS DIRECTED. 08/20/21   Livingston Rigg, MD    Allergies: Patient has no known allergies.    Review of Systems  All other systems reviewed and are negative.   Updated Vital Signs BP (!) 171/102   Pulse 76   Temp 98.6 F (37 C)   Resp 19   Ht 5' 9 (1.753 m)   Wt 95.3 kg   SpO2 96%   BMI 31.01 kg/m   Physical Exam Vitals and nursing note reviewed.  Constitutional:      Appearance: Normal appearance.  HENT:     Head: Normocephalic and atraumatic.     Ears:     Comments: Bilateral ear canals are erythematous and inflamed with drainage noted.  TMs appear clear. Pulmonary:     Effort: Pulmonary effort is normal.  Neurological:     Mental Status: He is alert.     (all labs ordered are listed, but only abnormal results are displayed) Labs Reviewed - No data to display  EKG: None  Radiology: No results found.   Procedures   Medications Ordered in the ED  HYDROcodone -acetaminophen  (NORCO/VICODIN) 5-325 MG per tablet 2 tablet (has no administration in time range)  Medical Decision Making Risk Prescription drug management.   Patient with bilateral otitis media.  To be treated with Cortisporin drops.  He has been taking Tylenol , but this is not helping with his pain, so will be prescribed a small quantity of hydrocodone  to take until the drops begin to take effect.     Final diagnoses:  None    ED Discharge Orders     None          Geroldine Berg, MD 09/08/23 [phone]

## 2023-09-08 NOTE — ED Triage Notes (Signed)
 Pov from home cc of bilateral ear pain since Monday after swimming. Pain is getting worse. 8/10
